# Patient Record
Sex: Female | Born: 1950 | Race: White | Hispanic: No | Smoking: Former smoker
Health system: Southern US, Community
[De-identification: ages and names within clinical notes are randomized; demographics above are authoritative.]

## PROBLEM LIST (undated history)

## (undated) DIAGNOSIS — F419 Anxiety disorder, unspecified: Secondary | ICD-10-CM

## (undated) DIAGNOSIS — L509 Urticaria, unspecified: Secondary | ICD-10-CM

## (undated) DIAGNOSIS — Z789 Other specified health status: Secondary | ICD-10-CM

## (undated) DIAGNOSIS — F32A Depression, unspecified: Secondary | ICD-10-CM

## (undated) DIAGNOSIS — Z8619 Personal history of other infectious and parasitic diseases: Secondary | ICD-10-CM

## (undated) HISTORY — DX: Personal history of other infectious and parasitic diseases: Z86.19

## (undated) HISTORY — DX: Urticaria, unspecified: L50.9

---

## 1998-04-02 ENCOUNTER — Encounter: Admission: RE | Admit: 1998-04-02 | Discharge: 1998-07-01 | Payer: Self-pay | Admitting: Anesthesiology

## 1999-12-19 ENCOUNTER — Other Ambulatory Visit: Admission: RE | Admit: 1999-12-19 | Discharge: 1999-12-19 | Payer: Self-pay | Admitting: *Deleted

## 2000-01-04 ENCOUNTER — Encounter: Payer: Self-pay | Admitting: Neurological Surgery

## 2000-01-04 ENCOUNTER — Ambulatory Visit (HOSPITAL_COMMUNITY): Admission: RE | Admit: 2000-01-04 | Discharge: 2000-01-04 | Payer: Self-pay | Admitting: Neurological Surgery

## 2000-01-11 ENCOUNTER — Encounter: Admission: RE | Admit: 2000-01-11 | Discharge: 2000-04-10 | Payer: Self-pay | Admitting: Anesthesiology

## 2000-01-25 ENCOUNTER — Encounter: Payer: Self-pay | Admitting: Family Medicine

## 2000-01-25 ENCOUNTER — Encounter: Admission: RE | Admit: 2000-01-25 | Discharge: 2000-01-25 | Payer: Self-pay | Admitting: Family Medicine

## 2000-06-15 ENCOUNTER — Encounter: Admission: RE | Admit: 2000-06-15 | Discharge: 2000-09-13 | Payer: Self-pay | Admitting: Anesthesiology

## 2000-07-27 ENCOUNTER — Encounter: Admission: RE | Admit: 2000-07-27 | Discharge: 2000-10-25 | Payer: Self-pay | Admitting: Anesthesiology

## 2000-11-15 ENCOUNTER — Encounter: Admission: RE | Admit: 2000-11-15 | Discharge: 2001-02-13 | Payer: Self-pay | Admitting: Anesthesiology

## 2001-01-30 ENCOUNTER — Other Ambulatory Visit: Admission: RE | Admit: 2001-01-30 | Discharge: 2001-01-30 | Payer: Self-pay | Admitting: *Deleted

## 2001-02-14 ENCOUNTER — Encounter: Admission: RE | Admit: 2001-02-14 | Discharge: 2001-05-15 | Payer: Self-pay | Admitting: Anesthesiology

## 2001-03-04 ENCOUNTER — Encounter: Admission: RE | Admit: 2001-03-04 | Discharge: 2001-03-04 | Payer: Self-pay | Admitting: *Deleted

## 2002-01-31 ENCOUNTER — Other Ambulatory Visit: Admission: RE | Admit: 2002-01-31 | Discharge: 2002-01-31 | Payer: Self-pay | Admitting: *Deleted

## 2003-02-04 ENCOUNTER — Other Ambulatory Visit: Admission: RE | Admit: 2003-02-04 | Discharge: 2003-02-04 | Payer: Self-pay | Admitting: *Deleted

## 2003-04-25 ENCOUNTER — Emergency Department (HOSPITAL_COMMUNITY): Admission: EM | Admit: 2003-04-25 | Discharge: 2003-04-25 | Payer: Self-pay | Admitting: Emergency Medicine

## 2003-04-25 ENCOUNTER — Encounter: Payer: Self-pay | Admitting: Emergency Medicine

## 2003-11-25 ENCOUNTER — Encounter: Admission: RE | Admit: 2003-11-25 | Discharge: 2003-11-25 | Payer: Self-pay | Admitting: Family Medicine

## 2005-04-06 ENCOUNTER — Encounter: Admission: RE | Admit: 2005-04-06 | Discharge: 2005-04-06 | Payer: Self-pay | Admitting: Family Medicine

## 2005-06-23 ENCOUNTER — Encounter: Admission: RE | Admit: 2005-06-23 | Discharge: 2005-06-23 | Payer: Self-pay | Admitting: Unknown Physician Specialty

## 2006-11-23 IMAGING — CR DG CHEST 2V
2 series · 2 of 2 positions shown · non-contrast
Comparison: none

CLINICAL DATA: Cough, headache, congestion. 
 CHEST X-RAY: 
 Two views of the chest show the lungs to be clear and slightly hyperaerated.  The heart is within normal limits in size.  No bony abnormality is seen.

[view not recorded (1 of 2)]
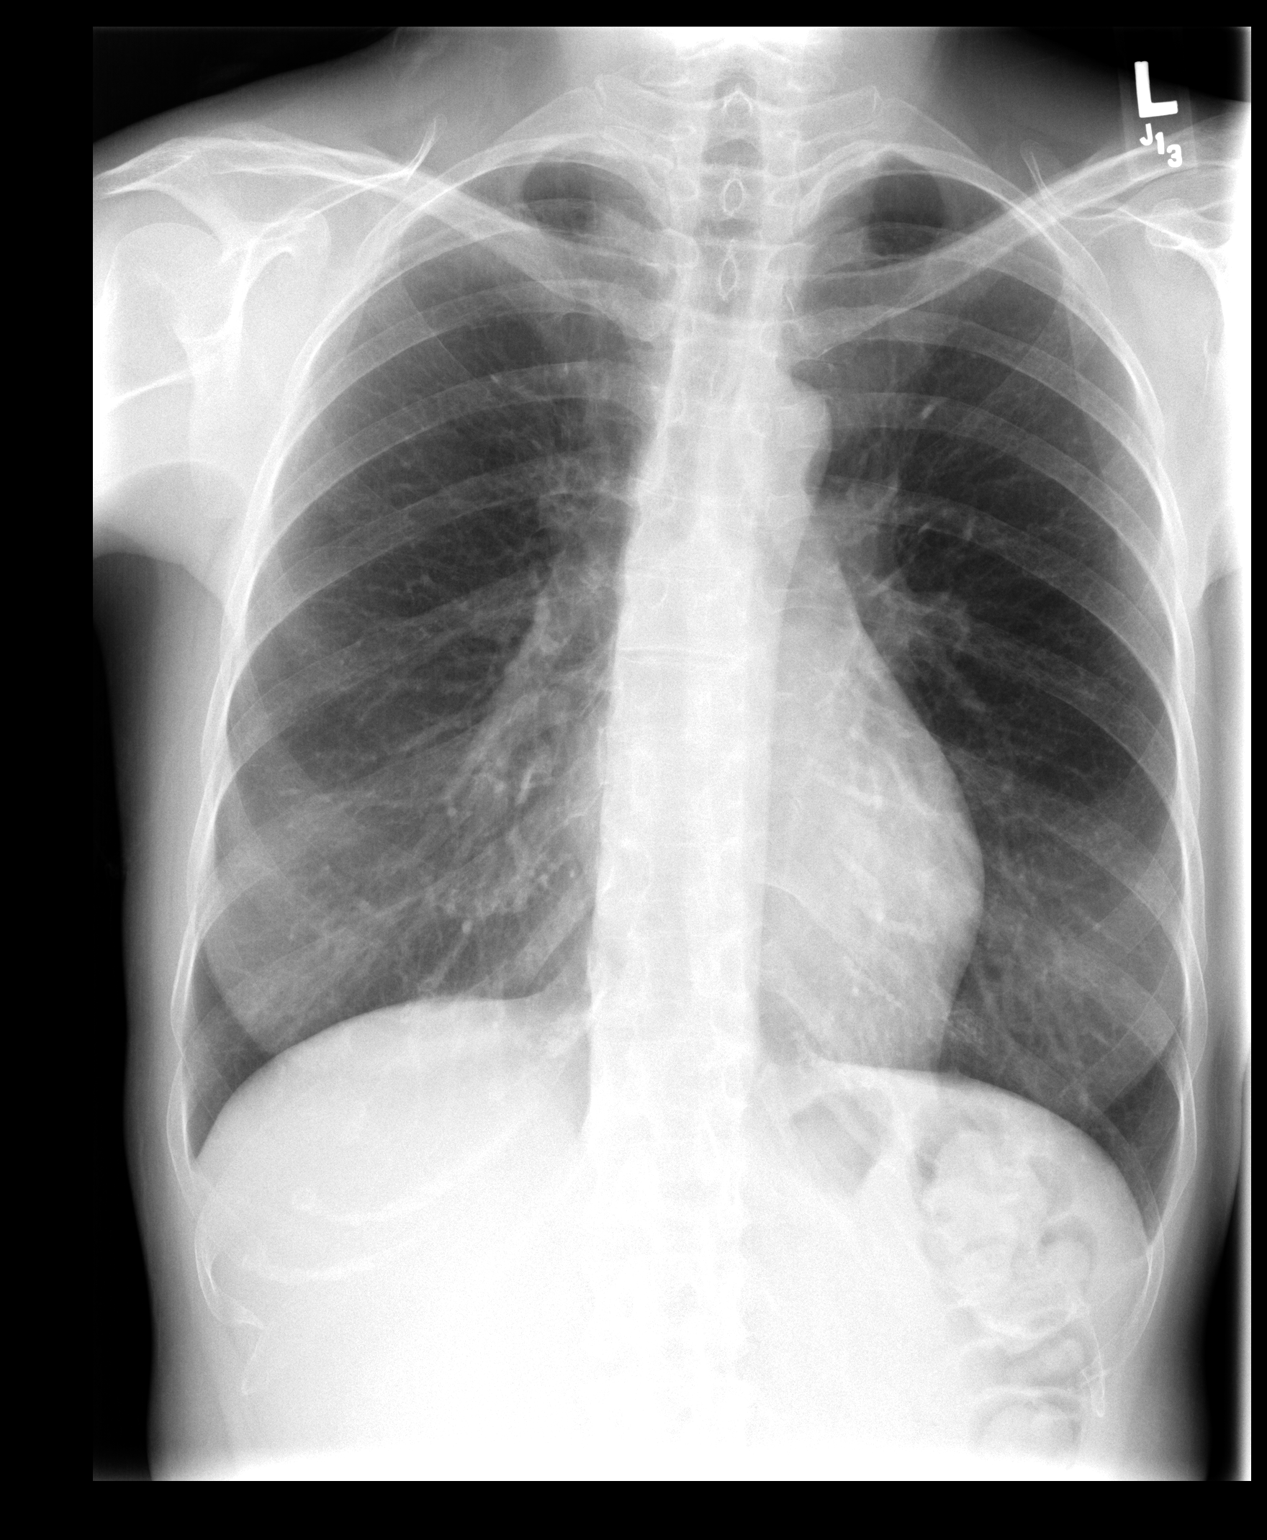

[view not recorded (2 of 2)]
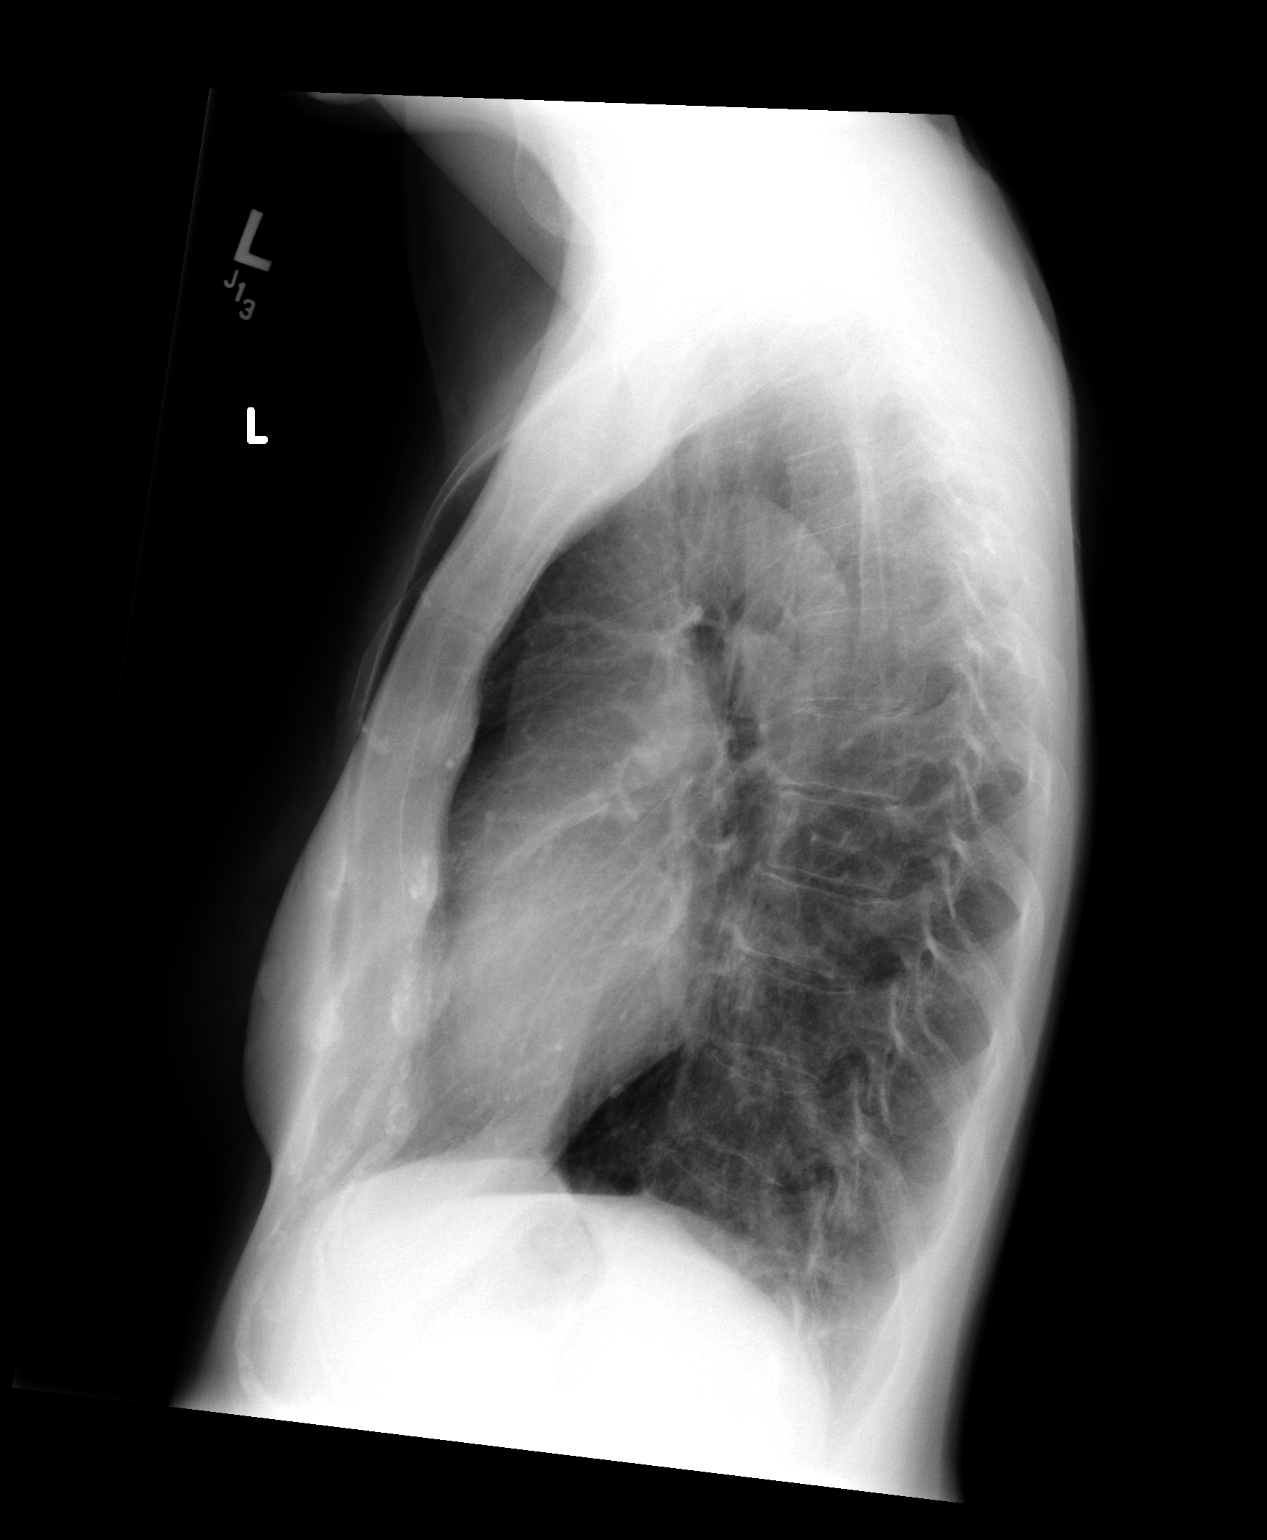

[2 of 2 positions shown; findings below may reference images not displayed]

IMPRESSION: No active lung disease.  Slight hyperaeration.

## 2016-07-08 ENCOUNTER — Inpatient Hospital Stay (HOSPITAL_COMMUNITY)
Admission: AD | Admit: 2016-07-08 | Discharge: 2016-07-08 | Disposition: A | Discharge status: Home / Self Care | Payer: Self-pay | Source: Ambulatory Visit | Attending: Obstetrics and Gynecology | Admitting: Obstetrics and Gynecology

## 2016-07-08 ENCOUNTER — Encounter (HOSPITAL_COMMUNITY): Payer: Self-pay | Admitting: *Deleted

## 2016-07-08 DIAGNOSIS — Z87891 Personal history of nicotine dependence: Secondary | ICD-10-CM | POA: Insufficient documentation

## 2016-07-08 DIAGNOSIS — T378X5A Adverse effect of other specified systemic anti-infectives and antiparasitics, initial encounter: Secondary | ICD-10-CM | POA: Insufficient documentation

## 2016-07-08 DIAGNOSIS — Z880 Allergy status to penicillin: Secondary | ICD-10-CM | POA: Insufficient documentation

## 2016-07-08 DIAGNOSIS — B373 Candidiasis of vulva and vagina: Secondary | ICD-10-CM | POA: Insufficient documentation

## 2016-07-08 HISTORY — DX: Other specified health status: Z78.9

## 2016-07-08 LAB — URINALYSIS, ROUTINE W REFLEX MICROSCOPIC
BILIRUBIN URINE: NEGATIVE
GLUCOSE, UA: NEGATIVE mg/dL
Ketones, ur: NEGATIVE mg/dL
Nitrite: NEGATIVE
PH: 6 (ref 5.0–8.0)
Protein, ur: NEGATIVE mg/dL

## 2016-07-08 LAB — URINE MICROSCOPIC-ADD ON

## 2016-07-08 NOTE — MAU Provider Note (Signed)
History     Chief Complaint  Patient presents with  . Vaginitis  . Rash   65 yo G2P2 DWF sent from urgent care for further management of diffuse vulvar/extremity rash with severe itching. Pt failed benadryl and was given atarax which also was not helping itching. Pt was seen in office and dx with candida vulvitis and vaginitis. She was placed on diflucan D1,4,7.and mycolog ointment. Pt has taken 2 of the diflucan which she has used in the past and had been using the topical cream for the vulva when the rash spread  OB History    Gravida Para Term Preterm AB Living   2         2   SAB TAB Ectopic Multiple Live Births                  Past Medical History:  Diagnosis Date  . Medical history non-contributory     Past Surgical History:  Procedure Laterality Date  . CESAREAN SECTION      History reviewed. No pertinent family history.  Social History  Substance Use Topics  . Smoking status: Former Games developermoker  . Smokeless tobacco: Never Used  . Alcohol use Yes     Comment: Occassional    Allergies:  Allergies  Allergen Reactions  . Peanut-Containing Drug Products     Throat swelling  . Sulfa Antibiotics     Tongue swelling   . Oxycodone Rash    Headache  . Penicillins Rash    No prescriptions prior to admission.     Physical Exam   Blood pressure 118/73, pulse 96, temperature 97.9 F (36.6 C), temperature source Oral, resp. rate 16, SpO2 100 %.  General appearance: alert, cooperative and mild distress Pelvic: external genitalia normal and vulva ; no edema. erythematous rash  lateral thighs outside the intertrigo and above the upper part of the mons  /outside the borders of the underwear  Introitus: some erythema Extremities: rash on forearms Skin: rash on inner thighs, lower abdomen ED Course  Candida vulvitis and vaginitis Allergic reaction  P)cont the topical cream as clinical response noted . Stop diflucan ? Allergic. Start medrol dose pak. Use OTC topical  itching creams. May continue atarax MDM   Lexus Barletta A, MD 1:08 PM 07/08/2016

## 2016-07-08 NOTE — MAU Note (Signed)
Pt states she has a rash that has been going on for one week.  Pt states she was seen in the office for it Monday.  Pt states she went to the urgent care yesterday and today and they sent her here.

## 2016-07-10 ENCOUNTER — Other Ambulatory Visit: Payer: Self-pay | Admitting: Obstetrics and Gynecology

## 2022-03-23 LAB — EXTERNAL GENERIC LAB PROCEDURE: COLOGUARD: NEGATIVE

## 2022-03-23 LAB — COLOGUARD: COLOGUARD: NEGATIVE

## 2023-04-21 ENCOUNTER — Other Ambulatory Visit: Payer: Self-pay

## 2023-04-21 ENCOUNTER — Emergency Department (HOSPITAL_BASED_OUTPATIENT_CLINIC_OR_DEPARTMENT_OTHER)
Admission: EM | Admit: 2023-04-21 | Discharge: 2023-04-21 | Disposition: A | Discharge status: Home / Self Care | Payer: Medicare Other | Attending: Emergency Medicine | Admitting: Emergency Medicine

## 2023-04-21 ENCOUNTER — Encounter (HOSPITAL_BASED_OUTPATIENT_CLINIC_OR_DEPARTMENT_OTHER): Payer: Self-pay | Admitting: Emergency Medicine

## 2023-04-21 DIAGNOSIS — Z91010 Allergy to peanuts: Secondary | ICD-10-CM | POA: Insufficient documentation

## 2023-04-21 DIAGNOSIS — R112 Nausea with vomiting, unspecified: Secondary | ICD-10-CM | POA: Diagnosis present

## 2023-04-21 DIAGNOSIS — E860 Dehydration: Secondary | ICD-10-CM | POA: Insufficient documentation

## 2023-04-21 HISTORY — DX: Depression, unspecified: F32.A

## 2023-04-21 HISTORY — DX: Anxiety disorder, unspecified: F41.9

## 2023-04-21 LAB — COMPREHENSIVE METABOLIC PANEL
ALT: 31 U/L (ref 0–44)
AST: 30 U/L (ref 15–41)
Albumin: 4.5 g/dL (ref 3.5–5.0)
Alkaline Phosphatase: 63 U/L (ref 38–126)
Anion gap: 16 — ABNORMAL HIGH (ref 5–15)
BUN: 20 mg/dL (ref 8–23)
CO2: 17 mmol/L — ABNORMAL LOW (ref 22–32)
Calcium: 9.3 mg/dL (ref 8.9–10.3)
Chloride: 104 mmol/L (ref 98–111)
Creatinine, Ser: 0.91 mg/dL (ref 0.44–1.00)
GFR, Estimated: >60 mL/min (ref >60)
Glucose, Bld: 116 mg/dL — ABNORMAL HIGH (ref 70–99)
Potassium: 3.7 mmol/L (ref 3.5–5.1)
Sodium: 137 mmol/L (ref 135–145)
Total Bilirubin: 1.1 mg/dL (ref 0.3–1.2)
Total Protein: 8 g/dL (ref 6.5–8.1)

## 2023-04-21 LAB — URINALYSIS, ROUTINE W REFLEX MICROSCOPIC
Bilirubin Urine: NEGATIVE
Glucose, UA: NEGATIVE mg/dL
Hgb urine dipstick: NEGATIVE
Ketones, ur: >=80 mg/dL — AB
Leukocytes,Ua: NEGATIVE
Nitrite: NEGATIVE
Protein, ur: NEGATIVE mg/dL
Specific Gravity, Urine: >=1.03 (ref 1.005–1.030)
pH: 6 (ref 5.0–8.0)

## 2023-04-21 LAB — CBC
HCT: 48.1 % — ABNORMAL HIGH (ref 36.0–46.0)
Hemoglobin: 16.3 g/dL — ABNORMAL HIGH (ref 12.0–15.0)
MCH: 30.4 pg (ref 26.0–34.0)
MCHC: 33.9 g/dL (ref 30.0–36.0)
MCV: 89.6 fL (ref 80.0–100.0)
Platelets: 215 10*3/uL (ref 150–400)
RBC: 5.37 MIL/uL — ABNORMAL HIGH (ref 3.87–5.11)
RDW: 12.2 % (ref 11.5–15.5)
WBC: 9.1 10*3/uL (ref 4.0–10.5)
nRBC: 0 % (ref 0.0–0.2)

## 2023-04-21 LAB — LIPASE, BLOOD: Lipase: 37 U/L (ref 11–51)

## 2023-04-21 MED ORDER — LORAZEPAM 2 MG/ML IJ SOLN
1.0000 mg | Freq: Once | INTRAMUSCULAR | Status: AC
  Administered 2023-04-21: 1 mg via INTRAVENOUS
  Filled 2023-04-21: qty 1

## 2023-04-21 MED ORDER — ONDANSETRON HCL 4 MG/2ML IJ SOLN
4.0000 mg | Freq: Once | INTRAMUSCULAR | Status: AC
  Administered 2023-04-21: 4 mg via INTRAVENOUS
  Filled 2023-04-21: qty 2

## 2023-04-21 MED ORDER — ALUM & MAG HYDROXIDE-SIMETH 200-200-20 MG/5ML PO SUSP
30.0000 mL | Freq: Once | ORAL | Status: AC
  Administered 2023-04-21: 30 mL via ORAL
  Filled 2023-04-21: qty 30

## 2023-04-21 MED ORDER — SUCRALFATE 1 G PO TABS: 1.0000 g | ORAL_TABLET | Freq: Three times a day (TID) | ORAL | 0 refills | Status: AC

## 2023-04-21 MED ORDER — SODIUM CHLORIDE 0.9 % IV BOLUS
1000.0000 mL | Freq: Once | INTRAVENOUS | Status: AC
  Administered 2023-04-21: 1000 mL via INTRAVENOUS

## 2023-04-21 MED ORDER — FAMOTIDINE 20 MG PO TABS: 20.0000 mg | ORAL_TABLET | Freq: Two times a day (BID) | ORAL | 0 refills | Status: AC

## 2023-04-21 NOTE — Discharge Instructions (Addendum)
Please read and follow all provided instructions.  Your diagnoses today include:  1. Nausea and vomiting, unspecified vomiting type   2. Dehydration     TTests performed today include: Blood cell counts and platelets: Normal white blood cell count Kidney and liver function tests: Normal kidney function Pancreas function test (called lipase) Urine test to look for infection: Suggest dehydration, no infection Vital signs. See below for your results today.   Medications prescribed:  Pepcid (famotidine) - antihistamine  You can find this medication over-the-counter.   DO NOT exceed:  20mg  Pepcid every 12 hours  Carafate - for stomach upset and to protect your stomach  Take any prescribed medications only as directed.  Home care instructions:  Follow any educational materials contained in this packet.  You should rest for the next several days. Keep drinking plenty of fluids and use the medicine for nausea as directed.   Drink clear liquids for the next 24 hours and introduce solid foods slowly after 24 hours using the b.r.a.t. diet (Bananas, Rice, Applesauce, Toast, Yogurt).    Follow-up instructions: Please follow-up with your primary care provider in the next 2-3 days for further evaluation of your symptoms. If you are not feeling better in 48 hours you may have a condition that is more serious and you need re-evaluation.   Return instructions:  SEEK IMMEDIATE MEDICAL ATTENTION IF: If you have pain that does not go away or becomes severe  A temperature above 101F develops  Repeated vomiting occurs (multiple episodes)  If you have pain that becomes localized to portions of the abdomen. The right side could possibly be appendicitis. In an adult, the left lower portion of the abdomen could be colitis or diverticulitis.  Blood is being passed in stools or vomit (bright red or black tarry stools)  You develop chest pain, difficulty breathing, dizziness or fainting, or become  confused, poorly responsive, or inconsolable (young children) If you have any other emergent concerns regarding your health  Additional Information: Abdominal (belly) pain can be caused by many things. Your caregiver performed an examination and possibly ordered blood/urine tests and imaging (CT scan, x-rays, ultrasound). Many cases can be observed and treated at home after initial evaluation in the emergency department. Even though you are being discharged home, abdominal pain can be unpredictable. Therefore, you need a repeated exam if your pain does not resolve, returns, or worsens. Most patients with abdominal pain don't have to be admitted to the hospital or have surgery, but serious problems like appendicitis and gallbladder attacks can start out as nonspecific pain. Many abdominal conditions cannot be diagnosed in one visit, so follow-up evaluations are very important.  Your vital signs today were: BP 132/76   Pulse 86   Temp 98.4 F (36.9 C) (Oral)   Resp 16   Wt 76.2 kg   SpO2 98%  If your blood pressure (bp) was elevated above 135/85 this visit, please have this repeated by your doctor within one month. --------------

## 2023-04-21 NOTE — ED Triage Notes (Signed)
Pt started Ozempic on Wednesday for weight loss and vomiting started the next day. Pt states she is also not able to sleep since taking the meds.

## 2023-04-21 NOTE — ED Provider Notes (Signed)
[place] [facility] Provider Note   CSN: [id number] Arrival date & time: 04/21/23  5284     History  Chief Complaint  Patient presents with   Emesis    Shari Walker is a 72 y.o. female.  Patient with history of hysterectomy, anxiety on chronic Xanax twice daily --presents to the emergency department for evaluation of nausea and body cramps.  Patient received her first injection of Ozempic on 04/18/2023.  The following morning she awoke with body cramps and severe nausea with vomiting.  She denies lip or tongue swelling, diarrhea, lightheadedness or syncope after administration of the medication.  She has been unable to take her medications.  She was prescribed Zofran and Protonix without improvement.  States that she has been unable to sleep as well.  No focal abdominal pain.  No urinary symptoms.  She has had some "acid reflux" with some burning in her anterior neck.  No chest pain or shortness of breath.       Home Medications Prior to Admission medications   Not on File      Allergies    Peanut-containing drug products, Sulfa antibiotics, Oxycodone, and Penicillins    Review of Systems   Review of Systems  Physical Exam Updated Vital Signs BP 132/73 (BP Location: Right Arm)   Pulse 93   Temp 98.4 F (36.9 C) (Oral)   Resp 18   Wt 76.2 kg   SpO2 100%   Physical Exam Vitals and nursing note reviewed.  Constitutional:      General: She is in acute distress.     Appearance: She is well-developed.  HENT:     Head: Normocephalic and atraumatic.     Right Ear: External ear normal.     Left Ear: External ear normal.     Nose: Nose normal.     Mouth/Throat:     Mouth: Mucous membranes are moist.  Eyes:     Conjunctiva/sclera: Conjunctivae normal.  Cardiovascular:     Rate and Rhythm: Normal rate and regular rhythm.     Heart sounds: No murmur heard. Pulmonary:     Effort: No respiratory distress.     Breath sounds: No  wheezing, rhonchi or rales.  Abdominal:     Palpations: Abdomen is soft.     Tenderness: There is no abdominal tenderness. There is no guarding or rebound.  Musculoskeletal:     Cervical back: Normal range of motion and neck supple.     Right lower leg: No edema.     Left lower leg: No edema.  Skin:    General: Skin is warm and dry.     Findings: No rash.  Neurological:     General: No focal deficit present.     Mental Status: She is alert. Mental status is at baseline.     Motor: No weakness.  Psychiatric:        Mood and Affect: Mood is anxious.     ED Results / Procedures / Treatments   Labs (all labs ordered are listed, but only abnormal results are displayed) Labs Reviewed  COMPREHENSIVE METABOLIC PANEL - Abnormal; Notable for the following components:      Result Value   CO2 17 (*)    Glucose, Bld 116 (*)    Anion gap 16 (*)    All other components within normal limits  CBC - Abnormal; Notable for the following components:   RBC 5.37 (*)    Hemoglobin 16.3 (*)  HCT 48.1 (*)    All other components within normal limits  URINALYSIS, ROUTINE W REFLEX MICROSCOPIC - Abnormal; Notable for the following components:   Ketones, ur >=80 (*)    All other components within normal limits  LIPASE, BLOOD    ED ECG REPORT   Date: 04/21/2023  Rate: 75  Rhythm: normal sinus rhythm  QRS Axis: normal  Intervals: normal  ST/T Wave abnormalities: normal  Conduction Disutrbances:none  Narrative Interpretation:   Old EKG Reviewed: none available  I have personally reviewed the EKG tracing and agree with the computerized printout as noted.   Radiology No results found.  Procedures Procedures    Medications Ordered in ED Medications  LORazepam (ATIVAN) injection 1 mg (1 mg Intravenous Given 04/21/23 1117)  sodium chloride 0.9 % bolus 1,000 mL (0 mLs Intravenous Stopped 04/21/23 1219)  ondansetron (ZOFRAN) injection 4 mg (4 mg Intravenous Given 04/21/23 1145)  alum & mag  hydroxide-simeth (MAALOX/MYLANTA) 200-200-20 MG/5ML suspension 30 mL (30 mLs Oral Given 04/21/23 1231)  sodium chloride 0.9 % bolus 1,000 mL (0 mLs Intravenous Stopped 04/21/23 1428)  alum & mag hydroxide-simeth (MAALOX/MYLANTA) 200-200-20 MG/5ML suspension 30 mL (30 mLs Oral Given 04/21/23 1452)    ED Course/ Medical Decision Making/ A&P    Patient seen and examined. History obtained directly from patient.  Friend is also at bedside.  Labs/EKG: Ordered CBC, CMP, lipase, UA.  Added EKG due to burning in the throat, however overall low concern for ACS.  Imaging: None ordered  Medications/Fluids: Ordered: IV Ativan, fluid bolus.  Patient takes Xanax regularly.  She thinks that she may have had a reaction to Ativan in the past but she cannot member what this was.  She is willing to trial this again.  She does not think that this was anaphylaxis.   Most recent vital signs reviewed and are as follows: BP 132/73 (BP Location: Right Arm)   Pulse 93   Temp 98.4 F (36.9 C) (Oral)   Resp 18   Wt 76.2 kg   SpO2 100%   Initial impression: Nausea and vomiting, possible element of benzodiazepine withdrawal  11:38 AM Reassessment performed. Patient appears stable.  She tolerated Ativan well.  Continue to have nausea and reflux symptoms.  Zofran ordered.  Labs personally reviewed and interpreted including: CBC with normal white blood cell count at 9.1, hemoglobin elevated at 16.3 otherwise unremarkable; CMP with glucose 116, normal electrolytes and liver function testing, minimally elevated anion gap at 16, lipase.  EKG personally reviewed and interpreted as above.  Reviewed pertinent lab work and imaging with patient at bedside. Questions answered.   Most current vital signs reviewed and are as follows: BP 132/73 (BP Location: Right Arm)   Pulse 93   Temp 98.4 F (36.9 C) (Oral)   Resp 18   Wt 76.2 kg   SpO2 100%   Plan: Continue hydration, reassessment  3:40 PM Reassessment performed.  Patient appears improved.  She continues to feel better.  No vomiting.  Labs personally reviewed and interpreted including: UA with greater than 80 ketones without glucose, consistent with dehydration.  Reviewed pertinent lab work and imaging with patient at bedside. Questions answered.   Most current vital signs reviewed and are as follows: BP 132/76   Pulse 86   Temp 98.4 F (36.9 C) (Oral)   Resp 16   Wt 76.2 kg   SpO2 98%   Plan: Discharge to home.   Prescriptions written for: Pepcid, Carafate.  Other home care  instructions discussed: Continue Zofran every 6 hours to help prevent, clear liquid diet today, advance to bland diet tomorrow morning if continuing to improve.  Stressed hydration.  ED return instructions discussed: The patient was urged to return to the Emergency Department immediately with worsening of current symptoms, worsening abdominal pain, persistent vomiting, blood noted in stools, fever, or any other concerns. The patient verbalized understanding.   Follow-up instructions discussed: Patient encouraged to follow-up with their PCP in 3 days.                             Medical Decision Making Amount and/or Complexity of Data Reviewed Labs: ordered.  Risk OTC drugs. Prescription drug management.   For this patient's complaint of abdominal pain, N/V, the following conditions were considered on the differential diagnosis: gastritis/PUD, enteritis/duodenitis, appendicitis, cholelithiasis/cholecystitis, cholangitis, pancreatitis, ruptured viscus, colitis, diverticulitis, small/large bowel obstruction, proctitis, cystitis, pyelonephritis, ureteral colic, aortic dissection, aortic aneurysm. In women, ectopic pregnancy, pelvic inflammatory disease, ovarian cysts, and tubo-ovarian abscess were also considered. Atypical chest etiologies were also considered including ACS, PE, and pneumonia.  Patient notes that symptoms began after receiving Ozempic injection and she  feels current symptoms are likely to be a side effect.  She required symptom control today and is improved with IV hydration and antiemetics.  Her abdominal exam is overall reassuring without focal tenderness.  She will follow-up with her doctor regarding using this medication in the future.  The patient's vital signs, pertinent lab work and imaging were reviewed and interpreted as discussed in the ED course. Hospitalization was considered for further testing, treatments, or serial exams/observation. However as patient is well-appearing, has a stable exam, and reassuring studies today, I do not feel that they warrant admission at this time. This plan was discussed with the patient who verbalizes agreement and comfort with this plan and seems reliable and able to return to the Emergency Department with worsening or changing symptoms.           Final Clinical Impression(s) / ED Diagnoses Final diagnoses:  Nausea and vomiting, unspecified vomiting type  Dehydration    Rx / DC Orders ED Discharge Orders          Ordered    famotidine (PEPCID) 20 MG tablet  2 times daily        04/21/23 1535    sucralfate (CARAFATE) 1 g tablet  3 times daily with meals & bedtime        04/21/23 1535              Renne Crigler, PA-C 04/21/23 1548    Horton, Clabe Seal, DO 04/22/23 [phone]

## 2023-04-21 NOTE — ED Notes (Signed)
Sprite given for PO challenge. 

## 2023-04-21 NOTE — ED Notes (Signed)
Pt tolerated PO challenge

## 2023-04-22 ENCOUNTER — Encounter (HOSPITAL_BASED_OUTPATIENT_CLINIC_OR_DEPARTMENT_OTHER): Payer: Self-pay | Admitting: Emergency Medicine

## 2023-04-22 ENCOUNTER — Telehealth (HOSPITAL_BASED_OUTPATIENT_CLINIC_OR_DEPARTMENT_OTHER): Payer: Self-pay | Admitting: Emergency Medicine

## 2023-04-22 ENCOUNTER — Emergency Department (HOSPITAL_BASED_OUTPATIENT_CLINIC_OR_DEPARTMENT_OTHER)
Admission: EM | Admit: 2023-04-22 | Discharge: 2023-04-22 | Disposition: A | Discharge status: Home / Self Care | Payer: Medicare Other | Attending: Emergency Medicine | Admitting: Emergency Medicine

## 2023-04-22 DIAGNOSIS — R110 Nausea: Secondary | ICD-10-CM

## 2023-04-22 DIAGNOSIS — Z91010 Allergy to peanuts: Secondary | ICD-10-CM | POA: Insufficient documentation

## 2023-04-22 DIAGNOSIS — R112 Nausea with vomiting, unspecified: Secondary | ICD-10-CM | POA: Diagnosis present

## 2023-04-22 LAB — CBC WITH DIFFERENTIAL/PLATELET
Abs Immature Granulocytes: 0.01 10*3/uL (ref 0.00–0.07)
Basophils Absolute: 0 10*3/uL (ref 0.0–0.1)
Basophils Relative: 0 %
Eosinophils Absolute: 0.1 10*3/uL (ref 0.0–0.5)
Eosinophils Relative: 2 %
HCT: 43 % (ref 36.0–46.0)
Hemoglobin: 14.3 g/dL (ref 12.0–15.0)
Immature Granulocytes: 0 %
Lymphocytes Relative: 11 %
Lymphs Abs: 0.6 10*3/uL — ABNORMAL LOW (ref 0.7–4.0)
MCH: 30.4 pg (ref 26.0–34.0)
MCHC: 33.3 g/dL (ref 30.0–36.0)
MCV: 91.5 fL (ref 80.0–100.0)
Monocytes Absolute: 0.6 10*3/uL (ref 0.1–1.0)
Monocytes Relative: 10 %
Neutro Abs: 4.4 10*3/uL (ref 1.7–7.7)
Neutrophils Relative %: 77 %
Platelets: 193 10*3/uL (ref 150–400)
RBC: 4.7 MIL/uL (ref 3.87–5.11)
RDW: 12.2 % (ref 11.5–15.5)
WBC: 5.7 10*3/uL (ref 4.0–10.5)
nRBC: 0 % (ref 0.0–0.2)

## 2023-04-22 LAB — COMPREHENSIVE METABOLIC PANEL
ALT: 28 U/L (ref 0–44)
AST: 30 U/L (ref 15–41)
Albumin: 3.9 g/dL (ref 3.5–5.0)
Alkaline Phosphatase: 54 U/L (ref 38–126)
Anion gap: 10 (ref 5–15)
BUN: 13 mg/dL (ref 8–23)
CO2: 20 mmol/L — ABNORMAL LOW (ref 22–32)
Calcium: 8.5 mg/dL — ABNORMAL LOW (ref 8.9–10.3)
Chloride: 108 mmol/L (ref 98–111)
Creatinine, Ser: 0.79 mg/dL (ref 0.44–1.00)
GFR, Estimated: >60 mL/min (ref >60)
Glucose, Bld: 108 mg/dL — ABNORMAL HIGH (ref 70–99)
Potassium: 3.8 mmol/L (ref 3.5–5.1)
Sodium: 138 mmol/L (ref 135–145)
Total Bilirubin: 1.1 mg/dL (ref 0.3–1.2)
Total Protein: 6.9 g/dL (ref 6.5–8.1)

## 2023-04-22 LAB — LIPASE, BLOOD: Lipase: 29 U/L (ref 11–51)

## 2023-04-22 MED ORDER — LORAZEPAM 2 MG/ML IJ SOLN
1.0000 mg | Freq: Once | INTRAMUSCULAR | Status: AC
  Administered 2023-04-22: 1 mg via INTRAVENOUS
  Filled 2023-04-22: qty 1

## 2023-04-22 MED ORDER — ONDANSETRON HCL 4 MG/2ML IJ SOLN
4.0000 mg | Freq: Once | INTRAMUSCULAR | Status: AC
  Administered 2023-04-22: 4 mg via INTRAVENOUS
  Filled 2023-04-22: qty 2

## 2023-04-22 MED ORDER — ONDANSETRON 4 MG PO TBDP: 4.0000 mg | ORAL_TABLET | Freq: Three times a day (TID) | ORAL | 0 refills | Status: AC | PRN

## 2023-04-22 MED ORDER — SODIUM CHLORIDE 0.9 % IV BOLUS
1000.0000 mL | Freq: Once | INTRAVENOUS | Status: AC
  Administered 2023-04-22: 1000 mL via INTRAVENOUS

## 2023-04-22 MED ORDER — SODIUM CHLORIDE 0.9 % IV BOLUS: 1000.0000 mL | Freq: Once | INTRAVENOUS | Status: DC

## 2023-04-22 NOTE — ED Notes (Signed)
ED Provider at bedside. 

## 2023-04-22 NOTE — ED Notes (Signed)
Pt endorses understanding that she will need a driver when d/c'd

## 2023-04-22 NOTE — ED Triage Notes (Signed)
Pt reports her nausea returned again today. Feels anxious. Has been able to keep down her anxiety medication.

## 2023-04-22 NOTE — ED Provider Notes (Signed)
[place] [facility] Provider Note   CSN: [id number] Arrival date & time: 04/22/23  4098     History  Chief Complaint  Patient presents with   Nausea    Shari Walker is a 72 y.o. female.  Patient returns to the ED with ongoing nausea and vomiting.  Seen here yesterday.  Recently started Ozempic a few days ago and then started symptoms.  She has not tried Zofran at home but still not feeling better.  Denies any chest pain shortness of breath or weakness or numbness.  No abdominal pain.  No fever or chills.  She felt better after yesterday.  She was able to tolerate her p.o. Xanax and had some rice but she threw up again this morning.  She is feeling really uncomfortable again.  However she continues to deny any abdominal pain.        Home Medications Prior to Admission medications   Medication Sig Start Date End Date Taking? Authorizing Provider  ondansetron (ZOFRAN-ODT) 4 MG disintegrating tablet Take 1 tablet (4 mg total) by mouth every 8 (eight) hours as needed. 04/22/23  Yes Jamie Hafford, DO  famotidine (PEPCID) 20 MG tablet Take 1 tablet (20 mg total) by mouth 2 (two) times daily for 10 days. 04/21/23 05/01/23  Renne Crigler, PA-C  sucralfate (CARAFATE) 1 g tablet Take 1 tablet (1 g total) by mouth 4 (four) times daily -  with meals and at bedtime. 04/21/23   Renne Crigler, PA-C      Allergies    Peanut-containing drug products, Sulfa antibiotics, Oxycodone, and Penicillins    Review of Systems   Review of Systems  Physical Exam Updated Vital Signs BP 116/64   Pulse 89   Temp 97.7 F (36.5 C) (Oral)   Resp 16   SpO2 99%  Physical Exam Vitals and nursing note reviewed.  Constitutional:      General: She is not in acute distress.    Appearance: She is well-developed.  HENT:     Head: Normocephalic and atraumatic.     Mouth/Throat:     Mouth: Mucous membranes are dry.  Eyes:     Extraocular Movements: Extraocular movements  intact.     Conjunctiva/sclera: Conjunctivae normal.     Pupils: Pupils are equal, round, and reactive to light.  Cardiovascular:     Rate and Rhythm: Normal rate and regular rhythm.     Pulses: Normal pulses.     Heart sounds: Normal heart sounds. No murmur heard. Pulmonary:     Effort: Pulmonary effort is normal. No respiratory distress.     Breath sounds: Normal breath sounds.  Abdominal:     Palpations: Abdomen is soft.     Tenderness: There is no abdominal tenderness.  Musculoskeletal:        General: No swelling.     Cervical back: Normal range of motion and neck supple.  Skin:    General: Skin is warm and dry.     Capillary Refill: Capillary refill takes less than 2 seconds.  Neurological:     General: No focal deficit present.     Mental Status: She is alert.  Psychiatric:        Mood and Affect: Mood normal.     ED Results / Procedures / Treatments   Labs (all labs ordered are listed, but only abnormal results are displayed) Labs Reviewed  CBC WITH DIFFERENTIAL/PLATELET - Abnormal; Notable for the following components:  Result Value   Lymphs Abs 0.6 (*)    All other components within normal limits  COMPREHENSIVE METABOLIC PANEL - Abnormal; Notable for the following components:   CO2 20 (*)    Glucose, Bld 108 (*)    Calcium 8.5 (*)    All other components within normal limits  LIPASE, BLOOD    EKG EKG Interpretation Date/Time:  Sunday April 22 2023 08:54:19 EDT Ventricular Rate:  88 PR Interval:  138 QRS Duration:  87 QT Interval:  357 QTC Calculation: 432 R Axis:   43  Text Interpretation: Sinus rhythm Confirmed by Hibba Schram ([id number]) on 04/22/2023 8:57:12 AM  Radiology No results found.  Procedures Procedures    Medications Ordered in ED Medications  sodium chloride 0.9 % bolus 1,000 mL (1,000 mLs Intravenous New Bag/Given 04/22/23 0859)  ondansetron (ZOFRAN) injection 4 mg (4 mg Intravenous Given 04/22/23 0900)  LORazepam (ATIVAN)  injection 1 mg (1 mg Intravenous Given 04/22/23 0903)    ED Course/ Medical Decision Making/ A&P                             Medical Decision Making Amount and/or Complexity of Data Reviewed Labs: ordered.  Risk Prescription drug management.   Shari Walker is here with nausea and vomiting.  History of depression and anxiety.  Normal vitals.  No fever.  Seen here yesterday for the same.  Has not done well at home.  Sounds like yesterday they thought possibly reaction to Ozempic as she recently started that.  Lab work was consistent with may be some dehydration but she had improvement while here went home.  Patient denies any chest pain or shortness of breath.  Differential diagnosis likely ongoing viral process or may be Ozempic side effects versus less likely acute intra-abdominal process.  Will recheck labs CBC, CMP, lipase and give IV fluids and IV Zofran and reevaluate.  Will give her dose IV Ativan as well.  Sounds like she is finally able to tolerate her p.o. Xanax yesterday but ate some rice was able to hold that down but threw up again this morning.  She is starting to feel uncomfortable again throughout her body.  But denies abdominal pain.  Lab work from my review interpretation is unremarkable.  Patient feeling much better after IV fluids, Zofran and Ativan.  Will prescribe her Zofran.  Overall I do suspect likely viral process or Ozempic side effect.  This should improve here in the next few days.  She understands return precautions.  She has no abdominal tenderness.  Repeat abdominal exam is benign.  Discharged in good condition.  This chart was dictated using voice recognition software.  Despite best efforts to proofread,  errors can occur which can change the documentation meaning.         Final Clinical Impression(s) / ED Diagnoses Final diagnoses:  Nausea    Rx / DC Orders ED Discharge Orders          Ordered    ondansetron (ZOFRAN-ODT) 4 MG disintegrating tablet   Every 8 hours PRN        06 /30/24 0940              Virgina Norfolk, DO 04/22/23 [phone]

## 2023-11-13 ENCOUNTER — Ambulatory Visit: Payer: Medicare Other | Admitting: Internal Medicine

## 2023-11-13 ENCOUNTER — Encounter: Payer: Self-pay | Admitting: Internal Medicine

## 2023-11-13 VITALS — BP 118/70 | HR 77 | Temp 96.4°F | Resp 17 | Ht 63.0 in | Wt 161.2 lb

## 2023-11-13 DIAGNOSIS — Z888 Allergy status to other drugs, medicaments and biological substances status: Secondary | ICD-10-CM

## 2023-11-13 DIAGNOSIS — Z889 Allergy status to unspecified drugs, medicaments and biological substances status: Secondary | ICD-10-CM

## 2023-11-13 DIAGNOSIS — J343 Hypertrophy of nasal turbinates: Secondary | ICD-10-CM | POA: Diagnosis not present

## 2023-11-13 DIAGNOSIS — J3089 Other allergic rhinitis: Secondary | ICD-10-CM | POA: Diagnosis not present

## 2023-11-13 DIAGNOSIS — T7800XA Anaphylactic reaction due to unspecified food, initial encounter: Secondary | ICD-10-CM

## 2023-11-13 DIAGNOSIS — T7800XD Anaphylactic reaction due to unspecified food, subsequent encounter: Secondary | ICD-10-CM

## 2023-11-13 MED ORDER — MUPIROCIN 2 % EX OINT: 1.0000 | TOPICAL_OINTMENT | Freq: Two times a day (BID) | CUTANEOUS | 0 refills | Status: AC

## 2023-11-13 MED ORDER — IPRATROPIUM BROMIDE 0.06 % NA SOLN: 2.0000 | Freq: Four times a day (QID) | NASAL | 12 refills | Status: AC

## 2023-11-13 MED ORDER — EPINEPHRINE 0.3 MG/0.3ML IJ SOAJ: 0.3000 mg | INTRAMUSCULAR | 1 refills | Status: AC | PRN

## 2023-11-13 NOTE — Progress Notes (Signed)
NEW PATIENT Date of Service/Encounter:  11/13/23 Referring provider: Alberteen Sam, * Primary care provider: Harvie Heck, MD  Subjective:  Shari Walker is a 73 y.o. female  presenting today for evaluation of chronic rhinitis  History obtained from: chart review and patient.   Discussed the use of AI scribe software for clinical note transcription with the patient, who gave verbal consent to proceed.  History of Present Illness   The patient, with a history of allergies, presents with worsening symptoms since November. She reports a constant runny nose, initially clear but now yellowish, throat itching, sneezing, itchy eyes, and coughing. She also describes a constant annoyance of postnasal drip and sores inside the nose, which she has been treating with Neosporin. Despite daily use of levocetirizine and azelastine nasal spray, the patient reports no relief. She has also tried Benadryl, which provided minimal relief.  The patient has attempted to identify potential triggers, including washing bedding in hot water and sanitizing pillows, but has not found any specific exposures that exacerbate symptoms. She has a known allergy to two types of dust mites. She was previously put on a Z-Pak, which provided some relief, but none of the allergy medications have been effective.  The patient also has a history of drug allergies, including sulfa, penicillin, and latex, with reactions ranging from tongue swelling to hives. The latex allergy was identified within the last three years, while the penicillin and sulfa allergies were identified in childhood.  In addition to allergies, the patient has a history of food allergies, including peanuts and tree nuts, specifically pecans and coconuts.  This manifests as vomiting, migraines and rash.  She also avoids MSG due to its association with migraines. The patient had a severe reaction to Ozempic, which resulted in hospitalization. She also has a  history of shingles and suffers from severe reflux, for which she is on Pepcid.        SIgE 10/29/23: positive to DM, candida Peanut <0.10 Total IgE 36     Other allergy screening: Asthma: no Rhino conjunctivitis: yes Food allergy: yes Medication allergy: yes sulfa causes tongue swelling (child) , penicillin urticaria (child) , latex urticaria (2021) , codeine migraines Hymenoptera allergy: no Urticaria: no Eczema:no History of recurrent infections suggestive of immunodeficency: no Vaccinations are up to date.   Past Medical History: Past Medical History:  Diagnosis Date   Anxiety    Depression    History of shingles    Medical history non-contributory    Urticaria    Medication List:  Current Outpatient Medications  Medication Sig Dispense Refill   ALPRAZolam (XANAX) 0.25 MG tablet Take by mouth.     azelastine (ASTELIN) 0.1 % nasal spray one spray by Both Nostrils route 2 (two) times daily. Use in each nostril as directed     busPIRone (BUSPAR) 15 MG tablet Take by mouth.     EPINEPHrine 0.3 mg/0.3 mL IJ SOAJ injection Inject into the muscle.     famotidine (PEPCID) 40 MG tablet Take 1 tablet by mouth 2 (two) times daily.     hydrOXYzine (ATARAX) 25 MG tablet 2 tablets as needed Orally every 6hrs     levocetirizine (XYZAL) 5 MG tablet Take by mouth.     metFORMIN (GLUCOPHAGE-XR) 500 MG 24 hr tablet Take 500 mg by mouth daily.     pantoprazole (PROTONIX) 40 MG tablet Take by mouth.     famotidine (PEPCID) 20 MG tablet Take 1 tablet (20 mg total) by mouth 2 (  two) times daily for 10 days. 15 tablet 0   ondansetron (ZOFRAN-ODT) 4 MG disintegrating tablet Take 1 tablet (4 mg total) by mouth every 8 (eight) hours as needed. (Patient not taking: Reported on 11/13/2023) 20 tablet 0   sucralfate (CARAFATE) 1 g tablet Take 1 tablet (1 g total) by mouth 4 (four) times daily -  with meals and at bedtime. (Patient not taking: Reported on 11/13/2023) 30 tablet 0   No current  facility-administered medications for this visit.   Known Allergies:  Allergies  Allergen Reactions   Ozempic (0.25 Or 0.5 Mg-Dose) [Semaglutide(0.25 Or 0.5mg -Dos)] Anaphylaxis and Shortness Of Breath   Peanut-Containing Drug Products     Throat swelling   Sulfa Antibiotics     Tongue swelling    Oxycodone Rash    Headache   Penicillins Rash   Past Surgical History: Past Surgical History:  Procedure Laterality Date   CESAREAN SECTION     Family History: No family history on file. Social History: Cleora lives townhome [place] in [place].  No water damage in the house.  Wood throughout.  Heat pump.  1 cat with access to bedroom.  No roaches and bed is to get off floor.  No dust mite precautions on bed but there are on pillows.  No cigarette exposures.  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  She was exposed to fumes, chemicals or dust.  HEPA filter in the home.   ROS:  All other systems negative except as noted per HPI.  Objective:  Blood pressure 118/70, pulse 77, temperature (!) 96.4 F (35.8 C), temperature source Temporal, resp. rate 17, height 5\' 3"  (1.6 m), weight 161 lb 3.2 oz (73.1 kg), SpO2 98%. Body mass index is 28.56 kg/m. Physical Exam:  General Appearance:  Alert, cooperative, no distress, appears stated age  Head:  Normocephalic, without obvious abnormality, atraumatic  Eyes:  Conjunctiva clear, EOM's intact  Ears EACs normal bilaterally  Nose: Nares normal,  erythematous nasal mucosa, clear rhinnorrhea , hypertrophic turbinates, no visible anterior polyps, and septum midline  Throat: Lips, tongue normal; teeth and gums normal, normal posterior oropharynx  Neck: Supple, symmetrical  Lungs:   clear to auscultation bilaterally, Respirations unlabored, no coughing  Heart:  regular rate and rhythm and no murmur, Appears well perfused  Extremities: No edema  Skin: Skin color, texture, turgor normal and no rashes or lesions on visualized  portions of skin  Neurologic: No gross deficits   Diagnostics: None done   Labs:  Lab Orders  No laboratory test(s) ordered today     Assessment and Plan  Assessment and Plan    Allergic Rhinitis Severe symptoms since November, including constant runny nose, throat itching, sneezing, itchy eyes, and coughing. Current treatment with levocetirizine and azelastine nasal spray is ineffective. Patient has a known allergy to dust mites. -Add Atrovent 0.6% 1-4 spray per nostril daily for drainage  -Continue levocetirizine 5mg  daily, can increase to twice daily for increased symptoms  - Continue Astelin 1 spray per nostril twice daily  - Start Flonase 1 spray per nostril twice daily  - Start sinus rinses (use 10 minutes prior to nasal sprays to increased efficacy  Nasal furuncle  - Use nasal saline gel to keep nose moisturized -Start mupirocin 2% ointment twice daily as needed for sores   Food Allergies History of severe reactions to peanuts, coconut, and pecans. Recent testing suggests possible outgrowing of peanut allergy, but patient has not been  re-challenged. -Do not reintroduce peanuts or coconuts until further evaluation. -Schedule food allergy testing for peanuts, coconut, and tree nuts  - Epipen 0.3mg  represcribed  - Allergy action plan provided   Adverse Drug Reactions History of severe reactions to Ozempic, sulfa, penicillin, latex, and codeine. Recent reaction to Ozempic resulted in hospitalization. -Do not restart Ozempic. -Consider future evaluation to de-label penicillin and sulfa allergies.  Gastroesophageal Reflux Disease Patient reports severe reflux and is currently on Pepcid. -Continue Pepcid 20mg  as prescribed. - Continue pantoprazole 40mg  as prescribed   Nightmares Patient reports severe, vivid nightmares. -Avoid adding Singulair due to potential side effect of vivid dreams.   Follow up: for skin testing (hold levocetirizine for 3 days prior)     This  note in its entirety was forwarded to the Provider who requested this consultation.  Other: samples provided of: saline rinses and nasal saline gel   Thank you for your kind referral. I appreciate the opportunity to take part in Yassmine's care. Please do not hesitate to contact me with questions.  Sincerely,  Thank you so much for letting me partake in your care today.  Don't hesitate to reach out if you have any additional concerns!  Ferol Luz, MD  [facility] [place], [place]

## 2023-11-13 NOTE — Patient Instructions (Addendum)
Allergic Rhinitis Severe symptoms since November, including constant runny nose, throat itching, sneezing, itchy eyes, and coughing. Current treatment with levocetirizine and azelastine nasal spray is ineffective. Patient has a known allergy to dust mites. -Add Atrovent 0.6% 1-4 spray per nostril daily for drainage  -Continue levocetirizine 5mg  daily, can increase to twice daily for increased symptoms  - Continue Astelin 1 spray per nostril twice daily  - Start Flonase 1 spray per nostril twice daily  - Start sinus rinses (use 10 minutes prior to nasal sprays to increased efficacy  Nasal furuncle  - Use nasal saline gel to keep nose moisturized -Start mupirocin 2% ointment twice daily as needed for sores   Food Allergies History of severe reactions to peanuts, coconut, and pecans. Recent testing suggests possible outgrowing of peanut allergy, but patient has not been re-challenged. -Do not reintroduce peanuts or coconuts until further evaluation. -Schedule food allergy testing for peanuts, coconut, and tree nuts  - Epipen 0.3mg  represcribed  - Allergy action plan provided   Adverse Drug Reactions History of severe reactions to Ozempic, sulfa, penicillin, latex, and codeine. Recent reaction to Ozempic resulted in hospitalization. -Do not restart Ozempic. -Consider future evaluation to de-label penicillin and sulfa allergies.  Gastroesophageal Reflux Disease Patient reports severe reflux and is currently on Pepcid. -Continue Pepcid 20mg  as prescribed. - Continue pantoprazole 40mg  as prescribed   Nightmares Patient reports severe, vivid nightmares. -Avoid adding Singulair due to potential side effect of vivid dreams.   Follow up: for skin testing (hold levocetirizine for 3 days prior)   Thank you so much for letting me partake in your care today.  Don't hesitate to reach out if you have any additional concerns!  Ferol Luz, MD  [facility] [place], [place]

## 2023-11-20 ENCOUNTER — Ambulatory Visit: Payer: Medicare Other | Admitting: Internal Medicine

## 2023-11-20 DIAGNOSIS — J3089 Other allergic rhinitis: Secondary | ICD-10-CM | POA: Diagnosis not present

## 2023-11-20 DIAGNOSIS — T7800XA Anaphylactic reaction due to unspecified food, initial encounter: Secondary | ICD-10-CM

## 2023-11-20 DIAGNOSIS — T7800XD Anaphylactic reaction due to unspecified food, subsequent encounter: Secondary | ICD-10-CM

## 2023-11-20 NOTE — Patient Instructions (Addendum)
Allergic Rhinitis Severe symptoms since November, including constant runny nose, throat itching, sneezing, itchy eyes, and coughing. Current treatment with levocetirizine and azelastine nasal spray is ineffective. Patient has a known allergy to dust mites Allergy test (11/20/23): positive to hickory tree pollen, dust mite, mold, cat  START avoidance measures  -Continue Atrovent 0.6% 1-4 spray per nostril daily for drainage  -Continue levocetirizine 5mg  daily, can increase to twice daily for increased symptoms  - Continue Astelin 1 spray per nostril twice daily  - Continue Flonase 1 spray per nostril twice daily  - Continue sinus rinses (use 10 minutes prior to nasal sprays to increased efficacy  Nasal furuncle  - Use nasal saline gel to keep nose moisturized -Continue  mupirocin 2% ointment twice daily as needed for sores   Food Allergies History of severe reactions to peanuts, coconut, and pecans. Recent testing suggests possible outgrowing of peanut allergy, but patient has not been re-challenged. -Do not reintroduce peanuts or coconuts until further evaluation. -Will get blood work to confirm negative skin testing today  - Epipen 0.3mg  represcribed  - Allergy action plan provided   Adverse Drug Reactions History of severe reactions to Ozempic, sulfa, penicillin, latex, and codeine. Recent reaction to Ozempic resulted in hospitalization. -Do not restart Ozempic. -Consider future evaluation to de-label penicillin and sulfa allergies.  Gastroesophageal Reflux Disease Patient reports severe reflux and is currently on Pepcid. -Continue Pepcid 20mg  as prescribed. - Continue pantoprazole 40mg  as prescribed   Nightmares Patient reports severe, vivid nightmares. -Avoid adding Singulair due to potential side effect of vivid dreams.   Follow up: consider food challenge for peanut first if blood is negative  Otherwise follow up in clinic in 6 months   Thank you so much for letting me  partake in your care today.  Don't hesitate to reach out if you have any additional concerns!  Ferol Luz, MD  [facility] [place], [place]  DUST MITE AVOIDANCE MEASURES:  There are three main measures that need and can be taken to avoid house dust mites:  Reduce accumulation of dust in general -reduce furniture, clothing, carpeting, books, stuffed animals, especially in bedroom  Separate yourself from the dust -use pillow and mattress encasements (can be found at stores such as [employer] or online) -avoid direct exposure to air condition flow -use a HEPA filter device, especially in the bedroom; you can also use a HEPA filter vacuum cleaner -wipe dust with a moist towel instead of a dry towel or broom when cleaning  Decrease mites and/or their secretions -wash clothing and linen and stuffed animals at highest temperature possible, at least every 2 weeks -stuffed animals can also be placed in a bag and put in a freezer overnight  Despite the above measures, it is impossible to eliminate dust mites or their allergen completely from your home.  With the above measures the burden of mites in your home can be diminished, with the goal of minimizing your allergic symptoms.  Success will be reached only when implementing and using all means together.  Reducing Pollen Exposure  The American Academy of Allergy, Asthma and Immunology suggests the following steps to reduce your exposure to pollen during allergy seasons.    Do not hang sheets or clothing out to dry; pollen may collect on these items. Do not mow lawns or spend time around freshly cut grass; mowing stirs up pollen. Keep windows closed at night.  Keep car windows closed while driving. Minimize morning  activities outdoors, a time when pollen counts are usually at their highest. Stay indoors as much as possible when pollen counts or humidity is high and on windy days when pollen tends to remain in the  air longer. Use air conditioning when possible.  Many air conditioners have filters that trap the pollen spores. Use a HEPA room air filter to remove pollen form the indoor air you breathe.  Control of Mold Allergen   Mold and fungi can grow on a variety of surfaces provided certain temperature and moisture conditions exist.  Outdoor molds grow on plants, decaying vegetation and soil.  The major outdoor mold, Alternaria and Cladosporium, are found in very high numbers during hot and dry conditions.  Generally, a late Summer - Fall peak is seen for common outdoor fungal spores.  Rain will temporarily lower outdoor mold spore count, but counts rise rapidly when the rainy period ends.  The most important indoor molds are Aspergillus and Penicillium.  Dark, humid and poorly ventilated basements are ideal sites for mold growth.  The next most common sites of mold growth are the bathroom and the kitchen.  Outdoor (Seasonal) Mold Control  Use air conditioning and keep windows closed Avoid exposure to decaying vegetation. Avoid leaf raking. Avoid grain handling. Consider wearing a face mask if working in moldy areas.    Indoor (Perennial) Mold Control   Maintain humidity below 50%. Clean washable surfaces with 5% bleach solution. Remove sources e.g. contaminated carpets.    Control of Dog or Cat Allergen  Avoidance is the best way to manage a dog or cat allergy. If you have a dog or cat and are allergic to dog or cats, consider removing the dog or cat from the home. If you have a dog or cat but don't want to find it a new home, or if your family wants a pet even though someone in the household is allergic, here are some strategies that may help keep symptoms at bay:  Keep the pet out of your bedroom and restrict it to only a few rooms. Be advised that keeping the dog or cat in only one room will not limit the allergens to that room. Don't pet, hug or kiss the dog or cat; if you do, wash your  hands with soap and water. High-efficiency particulate air (HEPA) cleaners run continuously in a bedroom or living room can reduce allergen levels over time. Regular use of a high-efficiency vacuum cleaner or a central vacuum can reduce allergen levels. Giving your dog or cat a bath at least once a week can reduce airborne allergen.

## 2023-11-20 NOTE — Progress Notes (Signed)
Date of Service/Encounter:  11/20/23  Allergy testing appointment   Initial visit on 11/13/23, seen for chronic rhinitis, food allergy, multiple drug allergies .  Please see that note for additional details.  Today reports for allergy diagnostic testing:    DIAGNOSTICS:  Skin Testing: Environmental allergy panel and select foods. Adequate positive and negative controls Results discussed with patient/family.  Airborne Adult Perc - 11/20/23 1116     Time Antigen Placed 1115    Allergen Manufacturer Waynette Buttery    Location Back    Number of Test 55    1. Control-Buffer 50% Glycerol Negative    2. Control-Histamine 3+    3. Bahia Negative    4. French Southern Territories Negative    5. Johnson Negative    6. Kentucky Blue Negative    7. Meadow Fescue Negative    8. Perennial Rye Negative    9. Timothy Negative    10. Ragweed Mix Negative    11. Cocklebur Negative    12. Plantain,  English Negative    13. Baccharis Negative    14. Dog Fennel Negative    15. Russian Thistle Negative    16. Lamb's Quarters Negative    17. Sheep Sorrell Negative    18. Rough Pigweed Negative    19. Marsh Elder, Rough Negative    20. Mugwort, Common Negative    21. Box, Elder Negative    22. Cedar, red Negative    23. Sweet Gum Negative    24. Pecan Pollen Negative    25. Pine Mix Negative    26. Walnut, Black Pollen Negative    27. Red Mulberry Negative    28. Ash Mix Negative    29. Birch Mix Negative    30. Beech American Negative    31. Cottonwood, Guinea-Bissau Negative    32. Hickory, White 3+    33. Maple Mix Negative    34. Oak, [place] Mix Negative    35. Sycamore Eastern Negative    36. Alternaria Alternata Negative    37. Cladosporium Herbarum Negative    38. Aspergillus Mix Negative    39. Penicillium Mix Negative    40. Bipolaris Sorokiniana (Helminthosporium) Negative    41. Drechslera Spicifera (Curvularia) Negative    42. Mucor Plumbeus Negative    43. Fusarium Moniliforme Negative     44. Aureobasidium Pullulans (pullulara) Negative    45. Rhizopus Oryzae Negative    46. Botrytis Cinera Negative    47. Epicoccum Nigrum Negative    48. Phoma Betae Negative    49. Dust Mite Mix 3+    50. Cat Hair 10,000 BAU/ml Negative    51.  Dog Epithelia Negative    52. Mixed Feathers Negative    53. Horse Epithelia Negative    54. Cockroach, German Negative    55. Tobacco Leaf Negative             13 Food Perc - 11/20/23 1117       Test Information   Time Antigen Placed 1115    Allergen Manufacturer Waynette Buttery    Location Back    Number of allergen test 13    Food Select      Food   1. Peanut Negative    2. Soybean Negative    3. Wheat Negative    4. Sesame Negative    5. Milk, Cow Negative    6. Casein Negative    7. Egg White, Chicken Negative    8. Shellfish  Mix Negative    9. Fish Mix Negative    10. Cashew Negative    11. Walnut Food Negative    12. Almond Negative    13. Hazelnut Negative             Intradermal - 11/20/23 1156     Time Antigen Placed 1200    Allergen Manufacturer Waynette Buttery    Location Arm    Number of Test 14    Control Negative    Bahia Negative    French Southern Territories Negative    Johnson Negative    7 Grass Negative    Ragweed Mix Negative    Weed Mix Negative    Mold 1 Negative    Mold 2 2+    Mold 3 Negative    Mold 4 2+    Cat 2+    Dog Negative    Cockroach Negative             Food Adult Perc - 11/20/23 1100     Time Antigen Placed 1115    Allergen Manufacturer Waynette Buttery    Location Back    Number of allergen test 4    14. Pecan Food Negative    15. Pistachio Negative    16. [place] Nut Negative    17. Coconut Negative             Allergy testing results were read and interpreted by myself, documented by clinical staff.  Patient provided with copy of allergy testing along with avoidance measures when indicated.   Ferol Luz, MD  [facility] [place]

## 2023-11-24 LAB — IGE NUT PROF. W/COMPONENT RFLX

## 2023-11-24 LAB — ALLERGEN COCONUT IGE: Allergen Coconut IgE: <0.1 kU/L

## 2023-11-26 NOTE — Progress Notes (Signed)
Blood work negative to tree nuts.  Recommend oral food challenge for tree nut reintroduction.  If she is interested we will get her scheduled with either myself or one of our nurse practitioners.

## 2023-11-27 ENCOUNTER — Other Ambulatory Visit: Payer: Self-pay

## 2023-11-30 ENCOUNTER — Telehealth: Payer: Self-pay

## 2023-11-30 NOTE — Telephone Encounter (Signed)
 Yes that is fine. We will do PN instead.

## 2023-11-30 NOTE — Telephone Encounter (Signed)
 Pt informed and stated understanding to bring in jif creamy peanut butter to appt Monday at 2pm and stop antihistamine

## 2023-11-30 NOTE — Telephone Encounter (Signed)
 Pt called in and said she is not concerned with tree nuts as pecans and coconut causes fever blisters within 2 days after consumption. She is more concerned with the peanut. Can we change the tree nut challenge to peanut butter. She has anaphylaxis with peanuts, but blood work was negative. She has anaphylaxis after being around peanuts on a plane 12 years ago. She is scheduled for Monday at 2pm for the tree nut challenge and just wants to change that to peanut butter challenge. She does have a peanut allergy  hx and it kept getting worse, for the last 12 years plus she has been avoiding peanuts.

## 2023-12-01 NOTE — Patient Instructions (Addendum)
 Shari Walker was not able to tolerate the peanut butter food challenge today at the office without adverse signs or symptoms of an allergic reaction.  - Monitor for allergic symptoms such as rash, wheezing, diarrhea, swelling, and vomiting for the next 24 hours. If severe symptoms occur, treat with EpiPen  injection and call 911. For less severe symptoms treat with Benadryl 4 teaspoonfuls every 4-6 hours and call the clinic.  -Continue to avoid peanuts and tree nuts. In case of an allergic reaction, give Benadryl 4 teaspoonfuls every 4 hours, and if life-threatening symptoms occur, inject with EpiPen  0.3 mg.  Schedule a follow up appointment in 4-6 months or sooner if needed.

## 2023-12-03 ENCOUNTER — Encounter: Payer: Self-pay | Admitting: Family

## 2023-12-03 ENCOUNTER — Ambulatory Visit: Payer: Medicare Other | Admitting: Family

## 2023-12-03 VITALS — BP 110/62 | HR 80 | Temp 97.2°F | Resp 18

## 2023-12-03 DIAGNOSIS — T7800XA Anaphylactic reaction due to unspecified food, initial encounter: Secondary | ICD-10-CM

## 2023-12-03 DIAGNOSIS — T7800XD Anaphylactic reaction due to unspecified food, subsequent encounter: Secondary | ICD-10-CM

## 2023-12-03 NOTE — Progress Notes (Signed)
[address] [place] [address] Dept: [phone]  FOLLOW UP NOTE  Patient ID: Shari Walker, female    DOB: Dec 18, 1950  Age: 73 y.o. MRN: [id number] Date of Office Visit: 12/03/2023  Assessment  Chief Complaint: Food/Drug Challenge  HPI Shari Walker is a 73 year old female who presents today for an oral food challenge to peanut butter.  She was last seen on November 13, 2023 by Dr. Marlynn Perking for rhinitis, and allergy with anaphylaxis due to food.  She denies any new diagnosis or surgeries since her last office visit.  She reports approximately 23 years ago while she was on an airplane the stewardess  opened a bag of peanuts for a passenger in front of her and she began having throat swelling after getting a whiff of it.  The person sitting next to her gave her his handkerchief that was wet to place over her nose.  The EMS was waiting for her when she landed.  She does not remember having any gastrointestinal or cutaneous symptoms.  She reports that she has been off all antihistamines for the past 3 days.  She does report clear rhinorrhea, nasal congestion, postnasal drip, itchy eyes, cough due to postnasal drip, and terrible heartburn ever since having a bad reaction to Ozempic.  Otherwise she denies any other cardiorespiratory, gastrointestinal, and cutaneous symptoms.  She does however mention that where they did her previous skin testing on her arms is itchy.  She is using over-the-counter hydrocortisone.   Drug Allergies:  Allergies  Allergen Reactions   Ozempic (0.25 Or 0.5 Mg-Dose) [Semaglutide(0.25 Or 0.5mg -Dos)] Anaphylaxis and Shortness Of Breath   Peanut-Containing Drug Products     Throat swelling   Sulfa Antibiotics     Tongue swelling    Oxycodone Rash    Headache   Penicillins Rash    Review of Systems: Negative except as per HPI   Physical Exam: BP 110/62   Pulse 80   Temp (!) 97.2 F (36.2 C) (Temporal)   Resp 18   SpO2 97%    Physical  Exam Constitutional:      Appearance: Normal appearance.  HENT:     Head: Normocephalic and atraumatic.     Comments: Pharynx normal eyes normal, ears normal, nose normal    Right Ear: Tympanic membrane, ear canal and external ear normal.     Left Ear: Tympanic membrane, ear canal and external ear normal.     Nose: Nose normal.     Mouth/Throat:     Mouth: Mucous membranes are moist.     Pharynx: Oropharynx is clear.  Eyes:     Conjunctiva/sclera: Conjunctivae normal.  Cardiovascular:     Rate and Rhythm: Regular rhythm.     Heart sounds: Normal heart sounds.  Pulmonary:     Effort: Pulmonary effort is normal.     Breath sounds: Normal breath sounds.     Comments: Lungs clear to auscultation Musculoskeletal:     Cervical back: Neck supple.  Skin:    General: Skin is warm.     Comments: No rashes or urticarial lesions  Neurological:     Mental Status: She is alert and oriented to person, place, and time.  Psychiatric:        Mood and Affect: Mood normal.        Behavior: Behavior normal.        Thought Content: Thought content normal.        Judgment: Judgment normal.  Diagnostics:  Open graded peanut butter oral challenge: The patient was not able to tolerate the challenge today without adverse signs or symptoms. Vital signs were stable throughout the challenge and observation period. She received multiple doses separated by 15 minutes, each of which was separated by vitals and a brief physical exam. She received the following doses: lip rub, repeat lip rub,1 gm, and 2 gm.  At 2:37 PM she reports nasal congestion.  Physical exam and vitals stable.  She denies any cardiorespiratory or gastrointestinal symptoms. At 2:46 PM she reports that she is no longer having stuffy nose and that her nasal congestion is back to baseline.  She denies any concomitant cardiorespiratory or gastrointestinal symptoms.  Discussed the options of repeating lip rub or stopping the challenge and she  would like to repeat the lip rub.  3:26 PM she reports that her lips feel different/puffy.  Physical exam and vitals stable.  Challenge stopped.  At 3:33 PM she reports that she feels a little "weird" and lightheaded.  Physical exam and vitals stable.  At 3:38 PM she reports her lip does not feel as puffy and her lightheadedness is not as bad.  3:53 PM she reports runny nose still having a little bit of itchiness on her right forearm near her elbow.  Erythema noted from scratching.  3:55 PM 10 mg of Zyrtec given and hydrocortisone cream was placed on right forearm/elbow region.  She denies any concomitant cardiorespiratory or gastrointestinal symptoms.  She mentions that her drainage is no worse since the beginning of her office visit.  At 4:03 PM she reports her itching is less.  At 4:07 PM she reports itching on her right back region.  Erythema noted.  She denies any cardiorespiratory or gastrointestinal symptoms.  She reports the lightheadedness is gone.  Physical exam and vitals stable.  At 4:43 PM.  She reports that she is no longer itching and denies any other concomitant cardiorespiratory, gastrointestinal, cutaneous symptoms.  She was monitored for 60 minutes following the last dose.   The patient had negative skin prick test and sIgE tests to peanut  and was not able to tolerate the open graded oral challenge today.  Assessment and Plan: 1. Allergy with anaphylaxis due to food     No orders of the defined types were placed in this encounter.   Patient Instructions  Doloras was not able to tolerate the peanut butter food challenge today at the office without adverse signs or symptoms of an allergic reaction.  - Monitor for allergic symptoms such as rash, wheezing, diarrhea, swelling, and vomiting for the next 24 hours. If severe symptoms occur, treat with EpiPen injection and call 911. For less severe symptoms treat with Benadryl 4 teaspoonfuls every 4-6 hours and call the clinic.  -Continue to  avoid peanuts and tree nuts. In case of an allergic reaction, give Benadryl 4 teaspoonfuls every 4 hours, and if life-threatening symptoms occur, inject with EpiPen 0.3 mg.  Schedule a follow up appointment in 4-6 months or sooner if needed.   Return in about 4 months (around 04/01/2024), or if symptoms worsen or fail to improve.    Thank you for the opportunity to care for this patient.  Please do not hesitate to contact me with questions.  Nehemiah Settle, FNP [facility]

## 2023-12-04 NOTE — Addendum Note (Signed)
Addended by: Berna Bue on: 12/04/2023 11:53 AM   Modules accepted: Orders

## 2024-03-10 ENCOUNTER — Telehealth: Payer: Self-pay

## 2024-03-10 NOTE — Telephone Encounter (Signed)
 Patient called [phone]. She spoke to [facility] insurance she is covered at 100 % for injections since she has met her $500 deductible $40 copay is waived if done in clinic. Please call to schedule for HP. #2 vials

## 2024-03-24 ENCOUNTER — Other Ambulatory Visit: Payer: Self-pay | Admitting: Internal Medicine

## 2024-03-24 DIAGNOSIS — J3089 Other allergic rhinitis: Secondary | ICD-10-CM

## 2024-03-24 NOTE — Progress Notes (Signed)
 Aeroallergen Immunotherapy  Ordering Provider: Dr. Orelia Binet  Patient Details Name: Shari Walker MRN: [id number] Date of Birth: 09/16/1951  Order 1 of 1  Vial Label: M-T-C-DM  0.2 ml (Volume)  1:10 Concentration -- Hickory* 0.2 ml (Volume)  1:10 Concentration -- Aspergillus mix 0.2 ml (Volume)  1:10 Concentration -- Penicillium mix 0.2 ml (Volume)  1:10 Concentration -- Fusarium moniliforme 0.2 ml (Volume)  1:40 Concentration -- Aureobasidium pullulans 0.2 ml (Volume)  1:10 Concentration -- Rhizopus oryzae 0.5 ml (Volume)  1:10 Concentration -- Cat Hair 0.5 ml (Volume)   AU Concentration -- Mite Mix (DF 5,000 & DP 5,000)   2.2  ml Extract Subtotal 2.8  ml Diluent  5.0  ml Maintenance Total  Schedule:   Blue Vial (1:100,000): Schedule B (6 doses) Yellow Vial (1:10,000): Schedule B (6 doses) Green Vial (1:1,000): Schedule B (6 doses) Red Vial (1:100): Schedule A (14 doses)  Special Instructions: After completion of the first Red Vial, please space to every two weeks. After completion of the second Red Vial, please space to every 4 weeks. Ok to up dose new vials at 0.79mL --> 0.3 mL --> 0.5 mL. Ok to come twice weekly, if desired, as long as there is 48 hours between injections.

## 2024-03-24 NOTE — Progress Notes (Signed)
VIALS TO BE MADE CLOSER TO APPT. ?

## 2024-03-27 DIAGNOSIS — J3089 Other allergic rhinitis: Secondary | ICD-10-CM | POA: Diagnosis not present

## 2024-03-27 NOTE — Progress Notes (Signed)
 VIALS MADE 03-27-24

## 2024-04-15 ENCOUNTER — Ambulatory Visit (INDEPENDENT_AMBULATORY_CARE_PROVIDER_SITE_OTHER)

## 2024-04-15 DIAGNOSIS — J309 Allergic rhinitis, unspecified: Secondary | ICD-10-CM | POA: Diagnosis not present

## 2024-04-28 ENCOUNTER — Ambulatory Visit (INDEPENDENT_AMBULATORY_CARE_PROVIDER_SITE_OTHER)

## 2024-04-28 DIAGNOSIS — J309 Allergic rhinitis, unspecified: Secondary | ICD-10-CM | POA: Diagnosis not present

## 2024-05-12 ENCOUNTER — Ambulatory Visit (INDEPENDENT_AMBULATORY_CARE_PROVIDER_SITE_OTHER)

## 2024-05-12 DIAGNOSIS — J309 Allergic rhinitis, unspecified: Secondary | ICD-10-CM

## 2024-05-22 ENCOUNTER — Ambulatory Visit (INDEPENDENT_AMBULATORY_CARE_PROVIDER_SITE_OTHER)

## 2024-05-22 DIAGNOSIS — J309 Allergic rhinitis, unspecified: Secondary | ICD-10-CM

## 2024-05-27 ENCOUNTER — Ambulatory Visit (INDEPENDENT_AMBULATORY_CARE_PROVIDER_SITE_OTHER)

## 2024-05-27 DIAGNOSIS — J309 Allergic rhinitis, unspecified: Secondary | ICD-10-CM | POA: Diagnosis not present

## 2024-06-04 ENCOUNTER — Other Ambulatory Visit: Payer: Self-pay | Admitting: Medical Genetics

## 2024-06-06 ENCOUNTER — Ambulatory Visit (INDEPENDENT_AMBULATORY_CARE_PROVIDER_SITE_OTHER)

## 2024-06-06 DIAGNOSIS — J309 Allergic rhinitis, unspecified: Secondary | ICD-10-CM | POA: Diagnosis not present

## 2024-06-13 ENCOUNTER — Ambulatory Visit (INDEPENDENT_AMBULATORY_CARE_PROVIDER_SITE_OTHER)

## 2024-06-13 DIAGNOSIS — J309 Allergic rhinitis, unspecified: Secondary | ICD-10-CM | POA: Diagnosis not present

## 2024-06-24 ENCOUNTER — Ambulatory Visit (INDEPENDENT_AMBULATORY_CARE_PROVIDER_SITE_OTHER)

## 2024-06-24 DIAGNOSIS — J309 Allergic rhinitis, unspecified: Secondary | ICD-10-CM

## 2024-07-17 ENCOUNTER — Ambulatory Visit (INDEPENDENT_AMBULATORY_CARE_PROVIDER_SITE_OTHER)

## 2024-07-17 DIAGNOSIS — J309 Allergic rhinitis, unspecified: Secondary | ICD-10-CM | POA: Diagnosis not present

## 2024-07-25 ENCOUNTER — Ambulatory Visit (INDEPENDENT_AMBULATORY_CARE_PROVIDER_SITE_OTHER)

## 2024-07-25 DIAGNOSIS — J309 Allergic rhinitis, unspecified: Secondary | ICD-10-CM | POA: Diagnosis not present

## 2024-08-01 ENCOUNTER — Ambulatory Visit (INDEPENDENT_AMBULATORY_CARE_PROVIDER_SITE_OTHER): Payer: Self-pay

## 2024-08-01 DIAGNOSIS — J309 Allergic rhinitis, unspecified: Secondary | ICD-10-CM

## 2024-08-07 ENCOUNTER — Ambulatory Visit (INDEPENDENT_AMBULATORY_CARE_PROVIDER_SITE_OTHER): Payer: Self-pay

## 2024-08-07 DIAGNOSIS — J309 Allergic rhinitis, unspecified: Secondary | ICD-10-CM

## 2024-08-12 ENCOUNTER — Ambulatory Visit (INDEPENDENT_AMBULATORY_CARE_PROVIDER_SITE_OTHER)

## 2024-08-12 DIAGNOSIS — J309 Allergic rhinitis, unspecified: Secondary | ICD-10-CM | POA: Diagnosis not present

## 2024-08-25 ENCOUNTER — Other Ambulatory Visit: Payer: Self-pay | Admitting: Medical Genetics

## 2024-08-25 DIAGNOSIS — Z006 Encounter for examination for normal comparison and control in clinical research program: Secondary | ICD-10-CM

## 2024-09-08 ENCOUNTER — Ambulatory Visit (INDEPENDENT_AMBULATORY_CARE_PROVIDER_SITE_OTHER)

## 2024-09-08 DIAGNOSIS — J3089 Other allergic rhinitis: Secondary | ICD-10-CM | POA: Diagnosis not present

## 2024-09-08 DIAGNOSIS — J309 Allergic rhinitis, unspecified: Secondary | ICD-10-CM | POA: Diagnosis not present

## 2024-09-10 ENCOUNTER — Telehealth: Payer: Self-pay

## 2024-09-10 NOTE — Telephone Encounter (Signed)
 Pt called stating that she had some itching and from her last shot, and she was schedule to come in tomorrow with Dr. Marinda.

## 2024-09-11 ENCOUNTER — Ambulatory Visit: Admitting: Internal Medicine

## 2024-09-11 ENCOUNTER — Other Ambulatory Visit: Payer: Self-pay

## 2024-09-11 ENCOUNTER — Encounter: Payer: Self-pay | Admitting: Internal Medicine

## 2024-09-11 VITALS — BP 114/68 | HR 82 | Temp 98.0°F | Resp 18 | Ht 63.0 in | Wt 158.0 lb

## 2024-09-11 DIAGNOSIS — J3089 Other allergic rhinitis: Secondary | ICD-10-CM | POA: Diagnosis not present

## 2024-09-11 DIAGNOSIS — L299 Pruritus, unspecified: Secondary | ICD-10-CM

## 2024-09-11 NOTE — Progress Notes (Signed)
 FOLLOW UP Date of Service/Encounter:   09/11/2024  Subjective:  Shari Walker (DOB: 1951/06/29) is a 73 y.o. female who [facility] in re-evaluation of the following: acute visit for reaction to allergy  injections History obtained from: chart review and patient.  For Review, LV was on 12/03/23  with Shari Craze, FNP seen for oral challenge to peanut allergy  which she did not pass due to mostly subjective symptoms with normal vitals. See below for summary of history and diagnostics.  This this is my first encounter with this patient.  Her primary allergist is Dr. Lorin. She is on allergy  injections for allergic rhinitis which were started 04/15/2024. She receives 1 vial containing molds, tree pollen (hickory tree only), cat and dust mite Her last documented injection was 09/08/2024 where she received 0.3 mL of her yellow vial. --------------------------------------------------- Today presents for follow-up. Discussed the use of AI scribe software for clinical note transcription with the patient, who gave verbal consent to proceed.  History of Present Illness Shari Walker is a 73 year old female who presents with generalized itching following an allergy  injection.  Post-allergy  injection reaction - Received allergy  injection on Monday after a three-week interval with reduced dose. - Developed significant localized swelling at the injection site, described as a 'goose egg', which resolved by the following day.  Generalized pruritus - Onset of generalized itching following resolution of injection site swelling. - Itching affects eyes, body, toes, and inside ears. - No associated rash. - Benadryl taken every four hours without relief.  Antihistamine use - Alternates between Zyrtec and Claritin, taking one daily as per 24-hour dosing instructions. - Confirmed antihistamine taken on the morning of allergy  injection.  Denies any  additional symptoms.   All medications reviewed by clinical staff and updated in chart. No new pertinent medical or surgical history except as noted in HPI.  ROS: All others negative except as noted per HPI.   Objective:  BP 114/68 (BP Location: Right Arm, Patient Position: Sitting, Cuff Size: Normal)   Pulse 82   Temp 98 F (36.7 C) (Temporal)   Resp 18   Ht 5' 3 (1.6 m)   Wt 158 lb (71.7 kg)   SpO2 99%   BMI 27.99 kg/m  Body mass index is 27.99 kg/m. Physical Exam: General Appearance:  Alert, cooperative, no distress, appears stated age  Head:  Normocephalic, without obvious abnormality, atraumatic  Eyes:  Conjunctiva clear, EOM's intact  Ears EACs normal bilaterally and normal TMs bilaterally  Nose: Nares normal, hypertrophic turbinates, normal mucosa, and no visible anterior polyps  Throat: Lips, tongue normal; teeth and gums normal, normal posterior oropharynx  Neck: Supple, symmetrical  Lungs:   clear to auscultation bilaterally, Respirations unlabored, no coughing  Heart:  regular rate and rhythm and no murmur, Appears well perfused  Extremities: No edema  Skin: Skin color, texture, turgor normal and no rashes or lesions on visualized portions of skin  Neurologic: No gross deficits   Labs:  Lab Orders  No laboratory test(s) ordered today   Assessment/Plan   Assessment and Plan Assessment & Plan Large Local Reaction to allergy  injection, rash resolved, persistent itching Recent allergic reaction to allergy  injection characterized by significant swelling and itching. Swelling resolved by the next day, but itching persists. No rash present. Previous reactions were smaller and resolved with observation. - Discontinued Benadryl. - Increase Zyrtec or Claritin to up to four tablets daily for itching as needed. - If  large local reaction occurs again, apply ice and topical steroid to the affected area such as over the counter hydrocortisone ointment. - Double  antihistamine dose on the day of the next allergy  injection. - shared decision making to not treat with steroids due to lack of rash and only symptomatic pruritus to minimize adverse effects of corticosteroids; pruritus should respond to high-dose antihistamines if allergy  related  Allergic rhinitis Chronic allergic rhinitis managed with daily antihistamines (Zyrtec or Claritin). - Continue daily antihistamines (Zyrtec or Claritin). - continue injections per protocol; will drop back to 0.2 mL of yellow vial at your next visit and return to regular build-up. - start taking 2 zyrtec or Claritin on the day of your allergy  injections - continue to bring your epinephrine  autoinjector to your injection appointments and wait the 30 minutes per clinic policy  Follow up : yearly with Dr. Lorin It was a pleasure meeting you in clinic today! Thank you for allowing me to participate in your care.  Rocky Endow, MD [facility]    Other: none  Rocky Endow, MD  [facility] 

## 2024-09-11 NOTE — Patient Instructions (Addendum)
 Large Local Reaction to allergy  injection, rash resolved, persistent itching Recent allergic reaction to allergy  injection characterized by significant swelling and itching. Swelling resolved by the next day, but itching persists. No rash present. Previous reactions were smaller and resolved with observation. - Discontinued Benadryl. - Increase Zyrtec or Claritin to up to four tablets daily for itching as needed. - If large local reaction occurs again, apply ice and topical steroid to the affected area such as over the counter hydrocortisone ointment. - Double antihistamine dose on the day of the next allergy  injection.  Allergic rhinitis Chronic allergic rhinitis managed with daily antihistamines (Zyrtec or Claritin). - Continue daily antihistamines (Zyrtec or Claritin). - continue injections per protocol; will drop back to 0.2 mL of yellow vial at your next visit and return to regular build-up. - start taking 2 zyrtec or claritin on the day of your allergy  injections - continue to bring your epinephrine  autoinjector to your injection appointments and wait the 30 minutes per clinic policy  Follow up : yearly with Dr. Lorin It was a pleasure meeting you in clinic today! Thank you for allowing me to participate in your care.

## 2024-09-12 NOTE — Telephone Encounter (Signed)
 I assume she saw Dr. Marinda and I can close this out

## 2024-09-15 ENCOUNTER — Encounter: Payer: Self-pay | Admitting: Family

## 2024-09-15 ENCOUNTER — Ambulatory Visit: Admitting: Family

## 2024-09-15 ENCOUNTER — Telehealth: Payer: Self-pay | Admitting: Internal Medicine

## 2024-09-15 VITALS — BP 126/72 | HR 90 | Temp 98.4°F | Resp 20 | Wt 158.0 lb

## 2024-09-15 DIAGNOSIS — L299 Pruritus, unspecified: Secondary | ICD-10-CM | POA: Diagnosis not present

## 2024-09-15 MED ORDER — PREDNISONE 10 MG PO TABS: ORAL_TABLET | ORAL | 0 refills | Status: AC

## 2024-09-15 NOTE — Progress Notes (Signed)
 [address] [place] [address] Dept: [phone]  FOLLOW UP NOTE  Patient ID: Shari Walker, female    DOB: 04/25/1951  Age: 73 y.o. MRN: [id number] Date of Office Visit: 09/15/2024  Assessment  Chief Complaint: Acute Visit and Pruritis  HPI Shari Walker is a 73 year old female who presents today for acute visit of pruritus.  She was last seen on September 11, 2024 by Dr. Marinda for large local reaction to allergy  injection, rash resolved, persistent itching, and allergic rhinitis.  She denies any new diagnosis or surgery since her last office visit.  She continues to itch ever since taking Claritin 2 tablets twice a day as recommended at her last office visit.  She is also still taking Benadryl.  She mentions that some things got to give because I am about to lose my mind from itching so bad.  She does not see a rash.  She is itchy mostly on her back, stomach, and arms.  Right now she is itchy on her back and her neck.  Also her eyeballs, lips, toes, and ears can be itchy.  Her legs are not so much itchy, but sometimes they can be itchy.  Her itching all started after her last allergy  injection on September 08, 2024 where she received 0.3 mL in her yellow vial.  That time she also had a goose egg knot on her arm.  She denies any new foods, new environment, new products, or new medications.  She denies any history of thyroid, liver, or renal disease.  She also denies HIV or malignancy.  She has not recently had any travel.  She lives alone with her cat.  She also denies fever, chills, weight loss, and concomitant cardiorespiratory or gastrointestinal symptoms.  She has been using a wooden scratcher so much it is smooth.  She reports that she has a history of severe depression and anxiety.  She denies being a diabetic and she cannot remember why she was on metformin, but reports that her coach was put her on this medication she thinks.     Drug Allergies:  Allergies  Allergen  Reactions   Ozempic (0.25 Or 0.5 Mg-Dose) [Semaglutide(0.25 Or 0.5mg -Dos)] Anaphylaxis and Shortness Of Breath   Peanut-Containing Drug Products     Throat swelling   Sulfa Antibiotics     Tongue swelling    Oxycodone Rash    Headache   Penicillins Rash    Review of Systems: Negative except as per HPI   Physical Exam: BP 126/72 (BP Location: Left Arm, Patient Position: Sitting, Cuff Size: Normal)   Pulse 90   Temp 98.4 F (36.9 C) (Oral)   Resp 20   Wt 158 lb (71.7 kg)   SpO2 98%   BMI 27.99 kg/m    Physical Exam Constitutional:      Appearance: Normal appearance.  HENT:     Head: Normocephalic and atraumatic.     Comments: Pharynx normal, eyes normal, ears normal, nose normal    Right Ear: Tympanic membrane, ear canal and external ear normal.     Left Ear: Tympanic membrane, ear canal and external ear normal.     Nose: Nose normal.     Mouth/Throat:     Mouth: Mucous membranes are moist.     Pharynx: Oropharynx is clear.  Eyes:     Conjunctiva/sclera: Conjunctivae normal.  Cardiovascular:     Rate and Rhythm: Regular rhythm.     Heart sounds: Normal heart sounds.  Pulmonary:     Effort: Pulmonary effort is normal.     Breath sounds: Normal breath sounds.     Comments: Lungs clear to auscultation Musculoskeletal:     Cervical back: Neck supple.  Skin:    General: Skin is warm.     Comments: Slight erythema noted on left lower/mid back  Neurological:     Mental Status: She is alert and oriented to person, place, and time.  Psychiatric:        Mood and Affect: Mood normal.        Behavior: Behavior normal.        Thought Content: Thought content normal.        Judgment: Judgment normal.     Diagnostics: None  Assessment and Plan: 1. Pruritus     Meds ordered this encounter  Medications   predniSONE  (DELTASONE ) 10 MG tablet    Sig: Take 1 tablet twice a day a day for 5 days    Dispense:  10 tablet    Refill:  0    Patient Instructions  Large  Local Reaction to allergy  injection, rash resolved, persistent itching Recent allergic reaction to allergy  injection characterized by significant swelling and itching. Swelling resolved by the next day, but itching persists. No rash present. Previous reactions were smaller and resolved with observation. -Start prednisone  10 mg taking 1 tablet twice a day for 5 days and stop - Discontinued Benadryl. - Increase Zyrtec or Claritin to up to four tablets daily for itching as needed.Once itching stops can decrease back to once a day dosing - If large local reaction occurs again, apply ice and topical steroid to the affected area such as over the counter hydrocortisone ointment. - Double antihistamine dose on the day of the next allergy  injection.  Allergic rhinitis Chronic allergic rhinitis managed with daily antihistamines (Zyrtec or Claritin). - Continue daily antihistamines (Zyrtec or Claritin). - continue injections per protocol but hold off until the itching stops; will drop back to 0.2 mL of yellow vial at your next visit and return to regular build-up. - continue taking 2 zyrtec or claritin on the day of your allergy  injections - continue to bring your epinephrine  autoinjector to your injection appointments and wait the 30 minutes per clinic policy  Follow up : yearly with Dr. Lorin It was a pleasure meeting you in clinic today! Thank you for allowing me to participate in your care.  No follow-ups on file.    Thank you for the opportunity to care for this patient.  Please do not hesitate to contact me with questions.  Wanda Craze, FNP [facility] 

## 2024-09-15 NOTE — Telephone Encounter (Signed)
 Patient is scheduled for acute visit with Wanda Craze, FNP today at 11:30

## 2024-09-15 NOTE — Telephone Encounter (Signed)
 Pt stated she is itching all over and requested a call back as soon as possible.

## 2024-09-15 NOTE — Patient Instructions (Signed)
 Large Local Reaction to allergy  injection, rash resolved, persistent itching Recent allergic reaction to allergy  injection characterized by significant swelling and itching. Swelling resolved by the next day, but itching persists. No rash present. Previous reactions were smaller and resolved with observation. -Start prednisone  10 mg taking 1 tablet twice a day for 5 days and stop - Discontinued Benadryl. - Increase Zyrtec or Claritin to up to four tablets daily for itching as needed.Once itching stops can decrease back to once a day dosing - If large local reaction occurs again, apply ice and topical steroid to the affected area such as over the counter hydrocortisone ointment. - Double antihistamine dose on the day of the next allergy  injection.  Allergic rhinitis Chronic allergic rhinitis managed with daily antihistamines (Zyrtec or Claritin). - Continue daily antihistamines (Zyrtec or Claritin). - continue injections per protocol but hold off until the itching stops; will drop back to 0.2 mL of yellow vial at your next visit and return to regular build-up. - continue taking 2 zyrtec or claritin on the day of your allergy  injections - continue to bring your epinephrine  autoinjector to your injection appointments and wait the 30 minutes per clinic policy  Follow up : yearly with Dr. Lorin It was a pleasure meeting you in clinic today! Thank you for allowing me to participate in your care.

## 2024-09-15 NOTE — Telephone Encounter (Signed)
Okay np

## 2024-09-23 ENCOUNTER — Encounter: Payer: Self-pay | Admitting: Internal Medicine

## 2024-10-28 ENCOUNTER — Encounter: Payer: Self-pay | Admitting: Internal Medicine

## 2024-10-28 ENCOUNTER — Ambulatory Visit: Admitting: Internal Medicine

## 2024-10-28 VITALS — BP 114/68 | HR 89 | Temp 97.2°F | Resp 17 | Wt 165.0 lb

## 2024-10-28 DIAGNOSIS — L299 Pruritus, unspecified: Secondary | ICD-10-CM | POA: Diagnosis not present

## 2024-10-28 DIAGNOSIS — J3089 Other allergic rhinitis: Secondary | ICD-10-CM | POA: Diagnosis not present

## 2024-10-28 NOTE — Progress Notes (Signed)
 "  FOLLOW UP Date of Service/Encounter:   10/28/2024  Subjective:  Shari Walker (DOB: Feb 03, 1951) is a 74 y.o. female who [facility] in re-evaluation of the following: acute visit for reaction to allergy  injections History obtained from: chart review and patient.  For Review, LV was on 09/15/24   with Shari Craze, Shari Walker seen for persistent pruritus after Ait injection on 08/23/24 . See below for summary of history and diagnostics.  She is on allergy  injections for allergic rhinitis which were started 04/15/2024. She receives 1 vial containing molds, tree pollen (hickory tree only), cat and dust mite Her last documented injection was 09/08/2024 where she received 0.3 mL of her yellow vial.  -no injections since  At last visit she was given prednisone , and told to increase to zyrtec to claritin to 4 a day.  --------------------------------------------------- Today presents for follow-up. Discussed the use of AI scribe software for clinical note transcription with the patient, who gave verbal consent to proceed.  History of Present Illness  Shari Walker is a 74 year old female who presents with persistent itching following allergy  injections.  Pruritus and cutaneous reactions following allergy  injections - Persistent severe pruritus began after allergy  injections. - Initial reaction included swelling at the injection site, progressing from the size of a quarter to a goose egg, extending from the elbow to the armpit. - Pruritus is severe, despite pepcid  BID (not started for itch) and zyrtec, claritin or xyzal (all taking once daily)  - Pruritus affects multiple areas, including her back and even her eyeballs. - She uses a back scratcher to alleviate the itch. - No allergy  injections received since November due to severity of reactions. - Concerned about interruption in allergy  treatment and its impact on her body's regulation.  Therapeutic  interventions and medication history - Treated with prednisone , increased doses of Zyrtec and Claritin, and topical creams without significant improvement in pruritus. - Currently taking Xyzal once daily, switched from Zyrtec and Claritin. - Pepcid  taken twice daily. - Xanax used for severe anxiety and depression. - Despite these medications, pruritus persists.   All medications reviewed by clinical staff and updated in chart. No new pertinent medical or surgical history except as noted in HPI.  ROS: All others negative except as noted per HPI.   Objective:  BP 114/68   Pulse 89   Temp (!) 97.2 F (36.2 C) (Temporal)   Resp 17   Wt 165 lb (74.8 kg)   SpO2 98%   BMI 29.23 kg/m  Body mass index is 29.23 kg/m. Physical Exam: General Appearance:  Alert, cooperative, no distress, appears stated age  Head:  Normocephalic, without obvious abnormality, atraumatic  Eyes:  Conjunctiva clear, EOM's intact  Ears EACs normal bilaterally and normal TMs bilaterally  Nose: Nares normal, hypertrophic turbinates, normal mucosa, and no visible anterior polyps  Throat: Lips, tongue normal; teeth and gums normal, normal posterior oropharynx  Neck: Supple, symmetrical  Lungs:   clear to auscultation bilaterally, Respirations unlabored, no coughing  Heart:  regular rate and rhythm and no murmur, Appears well perfused  Extremities: No edema  Skin: Skin color, texture, turgor normal and no rashes or lesions on visualized portions of skin  Neurologic: No gross deficits   Labs:  Lab Orders  No laboratory test(s) ordered today   Assessment/Plan    Patient Instructions  Large Local Reaction to allergy  injection, rash resolved, persistent itching - Increase xyzal to 5mg  twice daily   -  can increase up to 4 times a day as needed  - Continue pepcid  20mg  twice daily  - If no response in 4 weeks, we will do a lab work up for alternative causes of itch   Allergic rhinitis Chronic allergic rhinitis  managed with daily antihistamines (Zyrtec or Claritin). - Continue daily antihistamines (Zyrtec or Claritin). - HOLD INJECTIONS AT GREEN 0.5mL every 4 weeks for 3 months or until itch resolves   -add epi-rinses  - continue antihistamines as above  - continue to bring your epinephrine  autoinjector to your injection appointments and wait the 30 minutes per clinic policy  Follow up: 3 months    Thank you so much for letting me partake in your care today.  Don't hesitate to reach out if you have any additional concerns!  Hargis Springer, MD  [facility]- [place], [place]        "

## 2024-10-28 NOTE — Patient Instructions (Addendum)
 Large Local Reaction to allergy  injection, rash resolved, persistent itching - Increase xyzal to 5mg  twice daily   -can increase up to 4 times a day as needed  - Continue pepcid  20mg  twice daily  - If no response in 4 weeks, we will do a lab work up for alternative causes of itch   Allergic rhinitis Chronic allergic rhinitis managed with daily antihistamines (Zyrtec or Claritin). - Continue daily antihistamines (Zyrtec or Claritin). - HOLD INJECTIONS AT GREEN 0.5mL every 4 weeks for 3 months or until itch resolves   -add epi-rinses  - continue antihistamines as above  - continue to bring your epinephrine  autoinjector to your injection appointments and wait the 30 minutes per clinic policy  Follow up: 3 months
